# Patient Record
Sex: Female | Born: 1990 | Race: Black or African American | Hispanic: No | Marital: Single | Smoking: Never smoker
Health system: Southern US, Community
[De-identification: ages and names within clinical notes are randomized; demographics above are authoritative.]

## PROBLEM LIST (undated history)

## (undated) ENCOUNTER — Inpatient Hospital Stay (HOSPITAL_COMMUNITY): Payer: Self-pay

## (undated) DIAGNOSIS — K828 Other specified diseases of gallbladder: Secondary | ICD-10-CM

## (undated) DIAGNOSIS — Z789 Other specified health status: Secondary | ICD-10-CM

## (undated) HISTORY — DX: Other specified diseases of gallbladder: K82.8

---

## 2013-12-22 ENCOUNTER — Emergency Department (INDEPENDENT_AMBULATORY_CARE_PROVIDER_SITE_OTHER)
Admission: EM | Admit: 2013-12-22 | Discharge: 2013-12-22 | Disposition: A | Discharge status: Home / Self Care | Payer: Self-pay | Source: Home / Self Care | Attending: Family Medicine | Admitting: Family Medicine

## 2013-12-22 ENCOUNTER — Encounter (HOSPITAL_COMMUNITY): Payer: Self-pay | Admitting: Emergency Medicine

## 2013-12-22 DIAGNOSIS — N61 Mastitis without abscess: Secondary | ICD-10-CM

## 2013-12-22 LAB — POCT PREGNANCY, URINE: Preg Test, Ur: POSITIVE — AB

## 2013-12-22 MED ORDER — CEPHALEXIN 500 MG PO CAPS
500.0000 mg | ORAL_CAPSULE | Freq: Three times a day (TID) | ORAL | Status: DC
Start: 2013-12-22 — End: 2013-12-26

## 2013-12-22 MED ORDER — MINOCYCLINE HCL 100 MG PO CAPS: 100.0000 mg | ORAL_CAPSULE | Freq: Two times a day (BID) | ORAL | Status: DC

## 2013-12-22 NOTE — Discharge Instructions (Signed)
Warm compress twice a day when you take the antibiotic, take all of medicine, return as needed. °

## 2013-12-22 NOTE — ED Provider Notes (Signed)
CSN: [id number]     Arrival date & time 12/22/13  1253 History   First MD Initiated Contact with Patient 12/22/13 1411     Chief Complaint  Patient presents with  . Cellulitis   (Consider location/radiation/quality/duration/timing/severity/associated sxs/prior Treatment) Patient is a 23 y.o. female presenting with abscess. The history is provided by the patient.  Abscess Location:  Torso Torso abscess location:  L chest (left nipple) Abscess quality: fluctuance, induration, painful and redness   Red streaking: no   Duration:  1 week Progression:  Worsening (gradual pain and nipple swelling, removed ring 1 week ago and problem got worse.) Chronicity:  New Context: skin injury   Context comment:  From nipple ring.   History reviewed. No pertinent past medical history. History reviewed. No pertinent past surgical history. History reviewed. No pertinent family history. History  Substance Use Topics  . Smoking status: Never Smoker   . Smokeless tobacco: Not on file  . Alcohol Use: No   OB History   Grav Para Term Preterm Abortions TAB SAB Ect Mult Living                 Review of Systems  Constitutional: Negative.   Skin: Positive for wound.    Allergies  Review of patient's allergies indicates no known allergies.  Home Medications   Current Outpatient Rx  Name  Route  Sig  Dispense  Refill  . cephALEXin (KEFLEX) 500 MG capsule   Oral   Take 1 capsule (500 mg total) by mouth 3 (three) times daily. Take all of medicine and drink lots of fluids   21 capsule   0   . minocycline (MINOCIN,DYNACIN) 100 MG capsule   Oral   Take 1 capsule (100 mg total) by mouth 2 (two) times daily.   14 capsule   0    BP 126/89  Pulse 66  Temp(Src) 97.7 F (36.5 C) (Oral)  Resp 16  SpO2 100%  LMP 11/18/2013 Physical Exam  Nursing note and vitals reviewed. Constitutional: She appears well-developed and well-nourished.  Pulmonary/Chest:    Skin: Skin is warm and dry.    ED  Course  Procedures (including critical care time) Labs Review Labs Reviewed  POCT PREGNANCY, URINE - Abnormal; Notable for the following:    Preg Test, Ur POSITIVE (*)    All other components within normal limits  WOUND CULTURE   Imaging Review No results found.  EKG Interpretation    Date/Time:    Ventricular Rate:    PR Interval:    QRS Duration:   QT Interval:    QTC Calculation:   R Axis:     Text Interpretation:              MDM      Linna HoffJames D Kindl, MD 12/22/13 1443

## 2013-12-22 NOTE — ED Notes (Signed)
Recurrent infection and abscess in left nipple

## 2013-12-25 LAB — WOUND CULTURE

## 2013-12-26 ENCOUNTER — Emergency Department (HOSPITAL_COMMUNITY)
Admission: EM | Admit: 2013-12-26 | Discharge: 2013-12-27 | Disposition: A | Discharge status: Home / Self Care | Payer: Self-pay | Attending: Emergency Medicine | Admitting: Emergency Medicine

## 2013-12-26 ENCOUNTER — Encounter (HOSPITAL_COMMUNITY): Payer: Self-pay | Admitting: Emergency Medicine

## 2013-12-26 DIAGNOSIS — Z3490 Encounter for supervision of normal pregnancy, unspecified, unspecified trimester: Secondary | ICD-10-CM

## 2013-12-26 DIAGNOSIS — N898 Other specified noninflammatory disorders of vagina: Secondary | ICD-10-CM | POA: Insufficient documentation

## 2013-12-26 DIAGNOSIS — O9989 Other specified diseases and conditions complicating pregnancy, childbirth and the puerperium: Secondary | ICD-10-CM | POA: Insufficient documentation

## 2013-12-26 DIAGNOSIS — B3731 Acute candidiasis of vulva and vagina: Secondary | ICD-10-CM | POA: Insufficient documentation

## 2013-12-26 DIAGNOSIS — B373 Candidiasis of vulva and vagina: Secondary | ICD-10-CM | POA: Insufficient documentation

## 2013-12-26 LAB — URINE MICROSCOPIC-ADD ON

## 2013-12-26 LAB — URINALYSIS, ROUTINE W REFLEX MICROSCOPIC
Bilirubin Urine: NEGATIVE
Glucose, UA: NEGATIVE mg/dL
NITRITE: NEGATIVE
PROTEIN: NEGATIVE mg/dL
SPECIFIC GRAVITY, URINE: 1.024 (ref 1.005–1.030)
Urobilinogen, UA: 1 mg/dL (ref 0.0–1.0)
pH: 6 (ref 5.0–8.0)

## 2013-12-26 LAB — WET PREP, GENITAL
Clue Cells Wet Prep HPF POC: NONE SEEN
Trich, Wet Prep: NONE SEEN

## 2013-12-26 LAB — POCT PREGNANCY, URINE: Preg Test, Ur: POSITIVE — AB

## 2013-12-26 MED ORDER — CLOTRIMAZOLE 1 % VA CREA: 1.0000 | TOPICAL_CREAM | Freq: Every day | VAGINAL | Status: DC

## 2013-12-26 NOTE — Discharge Instructions (Signed)
Please read and follow all provided instructions.  Your diagnoses today include:  1. Vulvovaginal candidiasis     Tests performed today include:  Wet prep - shows yeast infection  Urine test - no definite urinary tract infection  Vital signs. See below for your results today.   Medications prescribed:   Clotrimazole - medication for yeast infection  Take any prescribed medications only as directed.  Home care instructions:  Follow any educational materials contained in this packet.  Follow-up instructions: Please follow-up with your primary care provider in the next 3 days for further evaluation of your symptoms. If you do not have a primary care doctor -- see below for referral information.   Return instructions:   Please return to the Emergency Department if you experience worsening symptoms.   Please return if you have any other emergent concerns.  Additional Information:  Your vital signs today were: BP 122/64   Pulse 84   Temp(Src) 97.8 F (36.6 C) (Oral)   Resp 16   Ht 5\' 3"  (1.6 m)   Wt 139 lb 14.4 oz (63.458 kg)   BMI 24.79 kg/m2   SpO2 100%   LMP 11/18/2013 If your blood pressure (BP) was elevated above 135/85 this visit, please have this repeated by your doctor within one month. --------------

## 2013-12-26 NOTE — ED Provider Notes (Signed)
CSN: [id number]     Arrival date & time 12/26/13  2157 History   First MD Initiated Contact with Patient 12/26/13 2222     This chart was scribed for non-physician practitioner, Rhea Bleacher PA-C working with Toy Baker, MD by Arlan Organ, ED Scribe. This patient was seen in room TR11C/TR11C and the patient's care was started at 10:31 PM.   Chief Complaint  Patient presents with  . Vaginal Itching   The history is provided by the patient. No language interpreter was used.    HPI Comments: Alora Gorey is a 23 y.o. Pregnant (first trimester) female who presents to the Emergency Department complaining of gradual onset, constant, moderate vaginal itching and irritation that initially started earlier this morning. Pt was recently seen on 1/7, diagnosed with a "staph infection", and was put on antibiotics. She reports having a yeast infection a number of years ago, and says her current symptoms are similar to what she experienced. She reports taking AZO with no improvement. She denies back pain, fever, dysuria, vaginal bleeding, or discharge. LMP beginning of December. Pt reports a positive pregnancy test about 2 days.   History reviewed. No pertinent past medical history. History reviewed. No pertinent past surgical history. No family history on file. History  Substance Use Topics  . Smoking status: Never Smoker   . Smokeless tobacco: Not on file  . Alcohol Use: No   OB History   Grav Para Term Preterm Abortions TAB SAB Ect Mult Living                 Review of Systems  Constitutional: Negative for fever.  HENT: Negative for rhinorrhea and sore throat.   Eyes: Negative for redness.  Respiratory: Negative for cough.   Cardiovascular: Negative for chest pain.  Gastrointestinal: Negative for nausea, vomiting, abdominal pain and diarrhea.  Genitourinary: Negative for dysuria, vaginal bleeding and vaginal discharge.       Vaginal Itching  Musculoskeletal: Negative for back pain  and myalgias.  Skin: Negative for rash.  Neurological: Negative for headaches.    Allergies  Review of patient's allergies indicates no known allergies.  Home Medications   Current Outpatient Rx  Name  Route  Sig  Dispense  Refill  . cephALEXin (KEFLEX) 500 MG capsule   Oral   Take 500 mg by mouth 3 (three) times daily.         . Prenatal Vit-Fe Fumarate-FA (MULTIVITAMIN-PRENATAL) 27-0.8 MG TABS tablet   Oral   Take 1 tablet by mouth daily at 12 noon.          Triage Vitals: BP 122/64  Pulse 84  Temp(Src) 97.8 F (36.6 C) (Oral)  Resp 16  Ht 5\' 3"  (1.6 m)  Wt 139 lb 14.4 oz (63.458 kg)  BMI 24.79 kg/m2  SpO2 100%  LMP 11/18/2013  Physical Exam  Nursing note and vitals reviewed. Constitutional: She is oriented to person, place, and time. She appears well-developed and well-nourished.  HENT:  Head: Normocephalic and atraumatic.  Eyes: EOM are normal.  Neck: Normal range of motion.  Cardiovascular: Normal rate.   Pulmonary/Chest: Effort normal.  Abdominal: Soft. She exhibits no distension. There is no tenderness. There is no rebound and no guarding.  Genitourinary: Uterus normal. There is rash (mild erythema) on the right labia. There is no tenderness or lesion on the right labia. There is rash (mild erythema) on the left labia. There is no tenderness or lesion on the left labia. Cervix exhibits no  motion tenderness and no discharge. Right adnexum displays no mass and no tenderness. Left adnexum displays no mass and no tenderness. No erythema or tenderness around the vagina. Vaginal discharge (mild white curdlike) found.  Musculoskeletal: Normal range of motion.  Neurological: She is alert and oriented to person, place, and time.  Skin: Skin is warm and dry.  Psychiatric: She has a normal mood and affect. Her behavior is normal.    ED Course  Procedures (including critical care time)  DIAGNOSTIC STUDIES: Oxygen Saturation is 100% on RA, Normal by my  interpretation.    COORDINATION OF CARE: 10:45 PM- Will perform pelvic exam. Discussed treatment plan with pt at bedside and pt agreed to plan.     Labs Review Labs Reviewed  POCT PREGNANCY, URINE - Abnormal; Notable for the following:    Preg Test, Ur POSITIVE (*)    All other components within normal limits  URINALYSIS, ROUTINE W REFLEX MICROSCOPIC   Imaging Review No results found.  EKG Interpretation   None      Vital signs reviewed and are as follows: Filed Vitals:   12/26/13 2355  BP: 107/68  Pulse: 70  Temp: 97.3 F (36.3 C)  Resp: 16   Pelvic performed by Dala DockFierke PA-S2 under my supervision. Nurse chaperone present.   Will treat for yeast infection.   [facility] given.  Patient urged to return with worsening symptoms or other concerns. Patient verbalized understanding and agrees with plan.     MDM   1. Vulvovaginal candidiasis   2. Pregnancy    Pt with yeast infection after initiation of antibiotics. Vaginal clotrimazole given due to oral azoles contraindicated in pregnancy.  I personally performed the services described in this documentation, which was scribed in my presence. The recorded information has been reviewed and is accurate.   Renne CriglerJoshua Vishwa Dais, PA-C 12/28/13 1526

## 2013-12-26 NOTE — ED Notes (Addendum)
Pt. reports vaginal itching / irritation onset today , denies vaginal discharge or dysuria . Pt. stated positive pregnancy test from her previous urgent care visit unsure of AOG .

## 2013-12-27 LAB — GC/CHLAMYDIA PROBE AMP
CT PROBE, AMP APTIMA: NEGATIVE
GC PROBE AMP APTIMA: NEGATIVE

## 2013-12-28 ENCOUNTER — Telehealth (HOSPITAL_COMMUNITY): Payer: Self-pay | Admitting: *Deleted

## 2013-12-28 LAB — URINE CULTURE
COLONY COUNT: NO GROWTH
CULTURE: NO GROWTH

## 2013-12-28 NOTE — ED Notes (Addendum)
Dr. Artis FlockKindl said he did give pt. Keflex and treatment is adequate.  I called pt. and left a message to call. Vassie MoselleYork, Marshal Schrecengost M 12/28/2013 Pt. called back.  Pt. verified x 2 and given results. Pt. told she was adequately treated and to finish all of antibiotics.  If not better after finishing her medicine to come back for a recheck. Pt. voiced understanding. 12/28/2013

## 2013-12-28 NOTE — ED Notes (Signed)
Wound culture breast: few Strep Group C.  Pt. adequately treated with Keflex.  Vassie MoselleYork, Ayodele Hartsock M 12/28/2013

## 2013-12-29 NOTE — ED Provider Notes (Signed)
Medical screening examination/treatment/procedure(s) were performed by non-physician practitioner and as supervising physician I was immediately available for consultation/collaboration.  Toy BakerAnthony T Berneita Sanagustin, MD 12/29/13 [id number]

## 2014-01-09 ENCOUNTER — Inpatient Hospital Stay (HOSPITAL_COMMUNITY)
Admission: AD | Admit: 2014-01-09 | Discharge: 2014-01-09 | Disposition: A | Discharge status: Home / Self Care | Payer: Self-pay | Attending: Obstetrics and Gynecology | Admitting: Obstetrics and Gynecology

## 2014-01-09 ENCOUNTER — Encounter (HOSPITAL_COMMUNITY): Payer: Self-pay | Admitting: *Deleted

## 2014-01-09 DIAGNOSIS — O210 Mild hyperemesis gravidarum: Secondary | ICD-10-CM | POA: Insufficient documentation

## 2014-01-09 DIAGNOSIS — O219 Vomiting of pregnancy, unspecified: Secondary | ICD-10-CM

## 2014-01-09 HISTORY — DX: Other specified health status: Z78.9

## 2014-01-09 LAB — URINALYSIS, ROUTINE W REFLEX MICROSCOPIC
Glucose, UA: NEGATIVE mg/dL
Hgb urine dipstick: NEGATIVE
Ketones, ur: >80 mg/dL — AB
Leukocytes, UA: NEGATIVE
NITRITE: NEGATIVE
PROTEIN: NEGATIVE mg/dL
Specific Gravity, Urine: 1.025 (ref 1.005–1.030)
UROBILINOGEN UA: 0.2 mg/dL (ref 0.0–1.0)
pH: 6 (ref 5.0–8.0)

## 2014-01-09 LAB — URINE MICROSCOPIC-ADD ON

## 2014-01-09 LAB — WET PREP, GENITAL
TRICH WET PREP: NONE SEEN
Yeast Wet Prep HPF POC: NONE SEEN

## 2014-01-09 MED ORDER — PROMETHAZINE HCL 12.5 MG PO TABS: 12.5000 mg | ORAL_TABLET | Freq: Four times a day (QID) | ORAL | Status: DC | PRN

## 2014-01-09 MED ORDER — PROMETHAZINE HCL 25 MG PO TABS
25.0000 mg | ORAL_TABLET | Freq: Once | ORAL | Status: AC
  Administered 2014-01-09: 25 mg via ORAL
  Filled 2014-01-09: qty 1

## 2014-01-09 NOTE — MAU Note (Signed)
Pt presents with complaints of nausea, vomiting and had a positive pregnancy test January the 9th. She states that she can't keep anything down.

## 2014-01-09 NOTE — MAU Note (Signed)
Pt states at beginning of month had abx then a week later developed yeast infection. Was given OTC cream. Unsure if yeast infection is gone. Denies pain or bleeding.

## 2014-01-09 NOTE — Discharge Instructions (Signed)
Morning Sickness °Morning sickness is when you feel sick to your stomach (nauseous) during pregnancy. This nauseous feeling may or may not come with vomiting. It often occurs in the morning but can be a problem any time of day. Morning sickness is most common during the first trimester, but it may continue throughout pregnancy. While morning sickness is unpleasant, it is usually harmless unless you develop severe and continual vomiting (hyperemesis gravidarum). This condition requires more intense treatment.  °CAUSES  °The cause of morning sickness is not completely known but seems to be related to normal hormonal changes that occur in pregnancy. °RISK FACTORS °You are at greater risk if you: °· Experienced nausea or vomiting before your pregnancy. °· Had morning sickness during a previous pregnancy. °· Are pregnant with more than one baby, such as twins. °TREATMENT  °Do not use any medicines (prescription, over-the-counter, or herbal) for morning sickness without first talking to your health care provider. Your health care provider may prescribe or recommend: °· Vitamin B6 supplements. °· Anti-nausea medicines. °· The herbal medicine ginger. °HOME CARE INSTRUCTIONS  °· Only take over-the-counter or prescription medicines as directed by your health care provider. °· Taking multivitamins before getting pregnant can prevent or decrease the severity of morning sickness in most women.   °· Eat a piece of dry toast or unsalted crackers before getting out of bed in the morning.   °· Eat five or six small meals a day.   °· Eat dry and bland foods (rice, baked potato ). Foods high in carbohydrates are often helpful.  °· Do not drink liquids with your meals. Drink liquids between meals.   °· Avoid greasy, fatty, and spicy foods.   °· Get someone to cook for you if the smell of any food causes nausea and vomiting.   °· If you feel nauseous after taking prenatal vitamins, take the vitamins at night or with a snack.  °· Snack  on protein foods (nuts, yogurt, cheese) between meals if you are hungry.   °· Eat unsweetened gelatins for desserts.   °· Wearing an acupressure wristband (worn for sea sickness) may be helpful.   °· Acupuncture may be helpful.   °· Do not smoke.   °· Get a humidifier to keep the air in your house free of odors.   °· Get plenty of fresh air. °SEEK MEDICAL CARE IF:  °· Your home remedies are not working, and you need medicine. °· You feel dizzy or lightheaded. °· You are losing weight. °SEEK IMMEDIATE MEDICAL CARE IF:  °· You have persistent and uncontrolled nausea and vomiting. °· You pass out (faint). °Document Released: 01/23/2007 Document Revised: 08/04/2013 Document Reviewed: 05/19/2013 °ExitCare® Patient Information ©2014 [employer]. ° °

## 2014-01-09 NOTE — MAU Provider Note (Signed)
History     CSN: [id number]  Arrival date and time: 01/09/14 1117   First Provider Initiated Contact with Patient 01/09/14 1204      Chief Complaint  Patient presents with  . Morning Sickness  . Possible Pregnancy   HPI  Toni Romero is a 23 y.o. G1P0 at 6719w3d who presents today with nausea and vomiting. She states that she vomits everything that she eats or drinks. She has not been given any nausea meds to try at this time. She was treated for an abscess, and then a yeast infection. She also feels that the yeast infection has not gotten better. She denies any vaginal bleeding or pain.   She has an appointment at the Clinic for 02/15/14.   Past Medical History  Diagnosis Date  . Medical history non-contributory     Past Surgical History  Procedure Laterality Date  . No past surgeries      History reviewed. No pertinent family history.  History  Substance Use Topics  . Smoking status: Never Smoker   . Smokeless tobacco: Never Used  . Alcohol Use: No    Allergies: No Known Allergies  Prescriptions prior to admission  Medication Sig Dispense Refill  . clotrimazole (GYNE-LOTRIMIN) 1 % vaginal cream Place 1 Applicatorful vaginally at bedtime. Use for 7 days.  45 g  0  . Prenatal Vit-Fe Fumarate-FA (MULTIVITAMIN-PRENATAL) 27-0.8 MG TABS tablet Take 1 tablet by mouth daily at 12 noon.        ROS Physical Exam   Blood pressure 123/84, pulse 84, temperature 97.6 F (36.4 C), resp. rate 16, height 5\' 3"  (1.6 m), weight 58.06 kg (128 lb), last menstrual period 11/18/2013.  Physical Exam  MAU Course  Procedures  Results for orders placed during the hospital encounter of 01/09/14 (from the past 24 hour(s))  URINALYSIS, ROUTINE W REFLEX MICROSCOPIC     Status: Abnormal   Collection Time    01/09/14 11:30 AM      Result Value Range   Color, Urine YELLOW  YELLOW   APPearance HAZY (*) CLEAR   Specific Gravity, Urine 1.025  1.005 - 1.030   pH 6.0  5.0 - 8.0   Glucose, UA NEGATIVE  NEGATIVE mg/dL   Hgb urine dipstick NEGATIVE  NEGATIVE   Bilirubin Urine SMALL (*) NEGATIVE   Ketones, ur >80 (*) NEGATIVE mg/dL   Protein, ur NEGATIVE  NEGATIVE mg/dL   Urobilinogen, UA 0.2  0.0 - 1.0 mg/dL   Nitrite NEGATIVE  NEGATIVE   Leukocytes, UA NEGATIVE  NEGATIVE  URINE MICROSCOPIC-ADD ON     Status: Abnormal   Collection Time    01/09/14 11:30 AM      Result Value Range   Squamous Epithelial / LPF MANY (*) RARE   WBC, UA 7-10  <3 WBC/hpf   RBC / HPF 7-10  <3 RBC/hpf   Bacteria, UA MANY (*) RARE   Urine-Other MUCOUS PRESENT    WET PREP, GENITAL     Status: Abnormal   Collection Time    01/09/14 12:15 PM      Result Value Range   Yeast Wet Prep HPF POC NONE SEEN  NONE SEEN   Trich, Wet Prep NONE SEEN  NONE SEEN   Clue Cells Wet Prep HPF POC MODERATE (*) NONE SEEN   WBC, Wet Prep HPF POC FEW (*) NONE SEEN   1333: Patient is tolerating PO fluids and food after phenergan   Assessment and Plan   1. Nausea/vomiting in pregnancy  Return to MAU as needed     Medication List         clotrimazole 1 % vaginal cream  Commonly known as:  GYNE-LOTRIMIN  Place 1 Applicatorful vaginally at bedtime. Use for 7 days.     multivitamin-prenatal 27-0.8 MG Tabs tablet  Take 1 tablet by mouth daily at 12 noon.     promethazine 12.5 MG tablet  Commonly known as:  PHENERGAN  Take 1 tablet (12.5 mg total) by mouth every 6 (six) hours as needed for nausea or vomiting.       Follow-up Information   Follow up with [facility]. (as planned )    Specialty:  Obstetrics and Gynecology   Contact information:   [address] [place] [address] [phone]       Tawnya Crook 01/09/2014, 12:07 PM

## 2014-01-10 LAB — URINE CULTURE: Colony Count: 8000

## 2014-01-12 NOTE — MAU Provider Note (Signed)
Attestation of Attending Supervision of Advanced Practitioner: Evaluation and management procedures were performed by the PA/NP/CNM/OB Fellow under my supervision/collaboration. Chart reviewed and agree with management and plan.  Tilda BurrowFERGUSON,Ocean Schildt V 01/12/2014 5:53 PM

## 2014-01-20 ENCOUNTER — Inpatient Hospital Stay (HOSPITAL_COMMUNITY)
Admission: AD | Admit: 2014-01-20 | Discharge: 2014-01-20 | Disposition: A | Discharge status: Home / Self Care | Payer: Self-pay | Source: Ambulatory Visit | Attending: Obstetrics & Gynecology | Admitting: Obstetrics & Gynecology

## 2014-01-20 ENCOUNTER — Encounter (HOSPITAL_COMMUNITY): Payer: Self-pay | Admitting: *Deleted

## 2014-01-20 DIAGNOSIS — R1110 Vomiting, unspecified: Secondary | ICD-10-CM

## 2014-01-20 DIAGNOSIS — IMO0002 Reserved for concepts with insufficient information to code with codable children: Secondary | ICD-10-CM | POA: Insufficient documentation

## 2014-01-20 DIAGNOSIS — E860 Dehydration: Secondary | ICD-10-CM | POA: Insufficient documentation

## 2014-01-20 DIAGNOSIS — O211 Hyperemesis gravidarum with metabolic disturbance: Secondary | ICD-10-CM | POA: Insufficient documentation

## 2014-01-20 DIAGNOSIS — R1115 Cyclical vomiting syndrome unrelated to migraine: Secondary | ICD-10-CM

## 2014-01-20 LAB — URINALYSIS, ROUTINE W REFLEX MICROSCOPIC
Bilirubin Urine: NEGATIVE
GLUCOSE, UA: 500 mg/dL — AB
HGB URINE DIPSTICK: NEGATIVE
Ketones, ur: >80 mg/dL — AB
LEUKOCYTES UA: NEGATIVE
Nitrite: NEGATIVE
PH: 6 (ref 5.0–8.0)
PROTEIN: NEGATIVE mg/dL
Specific Gravity, Urine: 1.015 (ref 1.005–1.030)
Urobilinogen, UA: 0.2 mg/dL (ref 0.0–1.0)

## 2014-01-20 MED ORDER — LACTATED RINGERS IV BOLUS (SEPSIS)
1000.0000 mL | Freq: Once | INTRAVENOUS | Status: AC
  Administered 2014-01-20: 1000 mL via INTRAVENOUS

## 2014-01-20 MED ORDER — DEXTROSE IN LACTATED RINGERS 5 % IV SOLN: 25.0000 mg | Freq: Once | INTRAVENOUS | Status: DC

## 2014-01-20 MED ORDER — ONDANSETRON HCL 4 MG/2ML IJ SOLN
4.0000 mg | Freq: Once | INTRAMUSCULAR | Status: AC
  Administered 2014-01-20: 4 mg via INTRAVENOUS
  Filled 2014-01-20: qty 2

## 2014-01-20 MED ORDER — FAMOTIDINE 20 MG PO TABS: 20.0000 mg | ORAL_TABLET | Freq: Two times a day (BID) | ORAL | Status: DC

## 2014-01-20 MED ORDER — DEXTROSE 5 % IN LACTATED RINGERS IV BOLUS
1000.0000 mL | Freq: Once | INTRAVENOUS | Status: AC
  Administered 2014-01-20: 1000 mL via INTRAVENOUS

## 2014-01-20 MED ORDER — FAMOTIDINE IN NACL 20-0.9 MG/50ML-% IV SOLN
20.0000 mg | Freq: Once | INTRAVENOUS | Status: AC
  Administered 2014-01-20: 20 mg via INTRAVENOUS
  Filled 2014-01-20: qty 50

## 2014-01-20 MED ORDER — ONDANSETRON HCL 4 MG PO TABS: 4.0000 mg | ORAL_TABLET | Freq: Four times a day (QID) | ORAL | Status: DC

## 2014-01-20 NOTE — MAU Provider Note (Signed)
History     CSN: [id number]  Arrival date and time: 01/20/14 1059   First Provider Initiated Contact with Patient 01/20/14 1204      Chief Complaint  Patient presents with  . Emesis During Pregnancy  . Extremity Weakness   HPI  Toni Toni is a 23 y.o. G1P0 at [redacted]w[redacted]d determined by LMP who presents today with nausea, vomiting, and fatigue.  She has been unable to eat solid food, but is hydrating and urinating. She denies syncopal episodes, but states she feels sick and gets short of breath when walking around the house. She describes chest pain and a burning sensation after she eats, possibly related to GERD. She denies vaginal bleeding, but occasionally has minimal clear/white discharge after vomiting.  She reports "random cramping before BM," but denies constant or frequent pain.  She was last seen in MAU on 01/09/14 for nausea and vomiting and was prescribed phenergan. She reports that the phenergan makes her chest hurt and did not relieve her nausea and vomiting. She has stopped taking her prenatal vitamins due to her Romero/V.  She is planning to receive prenatal care at [facility] with her first appt on 02/15/14. She has not received an [place].   OB History   Grav Para Term Preterm Abortions TAB SAB Ect Mult Living   1               Past Medical History  Diagnosis Date  . Medical history non-contributory     Past Surgical History  Procedure Laterality Date  . No past surgeries      History reviewed. No pertinent family history.  History  Substance Use Topics  . Smoking status: Never Smoker   . Smokeless tobacco: Never Used  . Alcohol Use: No    Allergies: No Known Allergies  Prescriptions prior to admission  Medication Sig Dispense Refill  . promethazine (PHENERGAN) 12.5 MG tablet Take 1 tablet (12.5 mg total) by mouth every 6 (six) hours as needed for nausea or vomiting.  30 tablet  3    Review of Systems  Constitutional: Positive for weight loss. Negative for fever.  Eyes:  Negative for blurred vision.  Respiratory: Positive for shortness of breath.   Gastrointestinal: Positive for heartburn, nausea and vomiting. Negative for diarrhea.  Genitourinary: Negative for dysuria.  Neurological: Positive for weakness. Negative for dizziness and headaches.   Physical Exam   Blood pressure 116/69, pulse 103, temperature 97.9 F (36.6 C), temperature source Oral, resp. rate 16, height 5' 3.5" (1.613 m), weight 52.889 kg (116 lb 9.6 oz), last menstrual period 11/18/2013, SpO2 99.00%.  Physical Exam  Constitutional: She is oriented to person, place, and time. She appears well-developed. No distress.  Eyes: EOM are normal.  Respiratory: Effort normal.  GI: Soft. She exhibits no distension and no mass. Bowel sounds are decreased. There is no tenderness. There is no rebound and no guarding.  Neurological: She is alert and oriented to person, place, and time.  Skin: Skin is warm and dry. There is pallor.  Psychiatric: She has a normal mood and affect.   Results for orders placed during the hospital encounter of 01/20/14 (from the past 24 hour(s))  URINALYSIS, ROUTINE W REFLEX MICROSCOPIC     Status: Abnormal   Collection Time    01/20/14  2:32 PM      Result Value Range   Color, Urine YELLOW  YELLOW   APPearance HAZY (*) CLEAR   Specific Gravity, Urine 1.015  1.005 - 1.030  pH 6.0  5.0 - 8.0   Glucose, UA 500 (*) NEGATIVE mg/dL   Hgb urine dipstick NEGATIVE  NEGATIVE   Bilirubin Urine NEGATIVE  NEGATIVE   Ketones, ur >80 (*) NEGATIVE mg/dL   Protein, ur NEGATIVE  NEGATIVE mg/dL   Urobilinogen, UA 0.2  0.0 - 1.0 mg/dL   Nitrite NEGATIVE  NEGATIVE   Leukocytes, UA NEGATIVE  NEGATIVE    MAU Course  Procedures  MDM Patient unable to give urine sample upon arrival UA today  Patient given 2 L IV fluids, 4 mg Zofran and 20 mg IV pepcid Patient able to tolerate PO in MAU  Assessment and Plan  Assessment 1. Nausea and vomiting 2. Dehydration  Plan Discharge  home Rx for Zofran and Pepcid sent to patient's pharmacy Counseling for Romero/V during first trimester, importance of hydration during pregnancy AVS contains information hyperemesis diet Patient encourage to keep appt in clinic, switch to gummy prenatal vitamins F/U with MAU as needed  Toni Toni 01/20/2014, 12:17 PM   I have seen and evaluated the patient with the PA student. I agree with the assessment and plan as written above.   Freddi StarrJulie Romero Ethier, PA-C 01/20/2014 6:00 PM

## 2014-01-20 NOTE — MAU Note (Signed)
Patient states she has had nausea and vomiting since she found out she was pregnant. Was given an Rx for Phenergan but she does not like the way it makes her chest hurt. Feels extreme weakness. Has a slight discharge, no diarrhea. Denies S/S of the flu.

## 2014-01-20 NOTE — Discharge Instructions (Signed)
Hyperemesis Gravidarum Diet °Hyperemesis gravidarum is a severe form of morning sickness. It is characterized by frequent and severe vomiting. It happens during the first trimester of pregnancy. It may be caused by the rapid hormone changes that happen during pregnancy. It is associated with a 5% weight loss of pre-pregnancy weight. The hyperemesis diet may be used to lessen symptoms of nausea and vomiting. °EATING GUIDELINES °· Eat 5 to 6 small meals daily instead of 3 large meals. °· Avoid foods with strong smells. °· Avoid drinking 30 minutes before and after meals. °· Avoid fried or high-fat foods, such as butter and cream sauces. °· Starchy foods are usually well-tolerated, such as cereal, toast, bread, potatoes, pasta, rice, and pretzels. °· Eat crackers before you get out of bed in the morning. °· Avoid spicy foods. °· Ginger may help with nausea. Add ¼ tsp ginger to hot tea or choose ginger tea. °· Continue to take your prenatal vitamins as directed by your caregiver. °SAMPLE MEAL PLAN °Breakfast  °· ½ cup oatmeal °· 1 slice toast °· 1 tsp heart-healthy margarine °· 1 tsp jelly °· 1 scrambled egg °Midmorning Snack  °· 1 cup low-fat yogurt °Lunch  °· Plain ham sandwich °· Carrot or celery sticks °· 1 small apple °· 3 graham crackers °Midafternoon Snack  °· Cheese and crackers °Dinner °· 4 oz pork tenderloin °· 1 small baked potato °· 1 tsp margarine °· ½ cup broccoli °· ½ cup grapes °Evening Snack °· 1 cup pudding °Document Released: 09/29/2007 Document Revised: 02/24/2012 Document Reviewed: 05/04/2013 °[employer]® Patient Information ©2014 [employer]. ° °

## 2014-02-15 ENCOUNTER — Ambulatory Visit (INDEPENDENT_AMBULATORY_CARE_PROVIDER_SITE_OTHER): Payer: Self-pay | Admitting: Advanced Practice Midwife

## 2014-02-15 ENCOUNTER — Ambulatory Visit (HOSPITAL_COMMUNITY)
Admission: RE | Admit: 2014-02-15 | Discharge: 2014-02-15 | Disposition: A | Discharge status: Home / Self Care | Payer: Self-pay | Source: Ambulatory Visit | Attending: Advanced Practice Midwife | Admitting: Advanced Practice Midwife

## 2014-02-15 ENCOUNTER — Encounter: Payer: Self-pay | Admitting: Advanced Practice Midwife

## 2014-02-15 VITALS — BP 104/64 | Temp 96.9°F | Wt 126.9 lb

## 2014-02-15 DIAGNOSIS — O209 Hemorrhage in early pregnancy, unspecified: Secondary | ICD-10-CM

## 2014-02-15 DIAGNOSIS — O021 Missed abortion: Secondary | ICD-10-CM

## 2014-02-15 DIAGNOSIS — IMO0002 Reserved for concepts with insufficient information to code with codable children: Secondary | ICD-10-CM

## 2014-02-15 DIAGNOSIS — O219 Vomiting of pregnancy, unspecified: Secondary | ICD-10-CM | POA: Insufficient documentation

## 2014-02-15 LAB — POCT URINALYSIS DIP (DEVICE)
Bilirubin Urine: NEGATIVE
Glucose, UA: NEGATIVE mg/dL
Ketones, ur: NEGATIVE mg/dL
LEUKOCYTES UA: NEGATIVE
NITRITE: NEGATIVE
PH: 7 (ref 5.0–8.0)
Protein, ur: NEGATIVE mg/dL
Specific Gravity, Urine: 1.025 (ref 1.005–1.030)
UROBILINOGEN UA: 2 mg/dL — AB (ref 0.0–1.0)

## 2014-02-15 NOTE — Progress Notes (Signed)
P= 68 Discussed appropriate weight gain (25-30lb); pt. Verbalized understanding.  New OB packet given.  Flu shot declined.

## 2014-02-15 NOTE — Patient Instructions (Signed)
Miscarriage A miscarriage is the sudden loss of an unborn baby (fetus) before the 20th week of pregnancy. Most miscarriages happen in the first 3 months of pregnancy. Sometimes, it happens before a woman even knows she is pregnant. A miscarriage is also called a "spontaneous miscarriage" or "early pregnancy loss." Having a miscarriage can be an emotional experience. Talk with your caregiver about any questions you may have about miscarrying, the grieving process, and your future pregnancy plans. CAUSES   Problems with the fetal chromosomes that make it impossible for the baby to develop normally. Problems with the baby's genes or chromosomes are most often the result of errors that occur, by chance, as the embryo divides and grows. The problems are not inherited from the parents.  Infection of the cervix or uterus.   Hormone problems.   Problems with the cervix, such as having an incompetent cervix. This is when the tissue in the cervix is not strong enough to hold the pregnancy.   Problems with the uterus, such as an abnormally shaped uterus, uterine fibroids, or congenital abnormalities.   Certain medical conditions.   Smoking, drinking alcohol, or taking illegal drugs.   Trauma.  Often, the cause of a miscarriage is unknown.  SYMPTOMS   Vaginal bleeding or spotting, with or without cramps or pain.  Pain or cramping in the abdomen or lower back.  Passing fluid, tissue, or blood clots from the vagina. DIAGNOSIS  Your caregiver will perform a physical exam. You may also have an ultrasound to confirm the miscarriage. Blood or urine tests may also be ordered. TREATMENT   Sometimes, treatment is not necessary if you naturally pass all the fetal tissue that was in the uterus. If some of the fetus or placenta remains in the body (incomplete miscarriage), tissue left behind may become infected and must be removed. Usually, a dilation and curettage (D and C) procedure is performed.  During a D and C procedure, the cervix is widened (dilated) and any remaining fetal or placental tissue is gently removed from the uterus.  Antibiotic medicines are prescribed if there is an infection. Other medicines may be given to reduce the size of the uterus (contract) if there is a lot of bleeding.  If you have Rh negative blood and your baby was Rh positive, you will need a Rh immunoglobulin shot. This shot will protect any future baby from having Rh blood problems in future pregnancies. HOME CARE INSTRUCTIONS   Your caregiver may order bed rest or may allow you to continue light activity. Resume activity as directed by your caregiver.  Have someone help with home and family responsibilities during this time.   Keep track of the number of sanitary pads you use each day and how soaked (saturated) they are. Write down this information.   Do not use tampons. Do not douche or have sexual intercourse until approved by your caregiver.   Only take over-the-counter or prescription medicines for pain or discomfort as directed by your caregiver.   Do not take aspirin. Aspirin can cause bleeding.   Keep all follow-up appointments with your caregiver.   If you or your partner have problems with grieving, talk to your caregiver or seek counseling to help cope with the pregnancy loss. Allow enough time to grieve before trying to get pregnant again.  SEEK IMMEDIATE MEDICAL CARE IF:   You have severe cramps or pain in your back or abdomen.  You have a fever.  You pass large blood clots (walnut-sized   or larger) ortissue from your vagina. Save any tissue for your caregiver to inspect.   Your bleeding increases.   You have a thick, bad-smelling vaginal discharge.  You become lightheaded, weak, or you faint.   You have chills.  MAKE SURE YOU:  Understand these instructions.  Will watch your condition.  Will get help right away if you are not doing well or get  worse. Document Released: 05/28/2001 Document Revised: 03/29/2013 Document Reviewed: 01/21/2012 [employer] Patient Information 2014 [employer].  

## 2014-02-15 NOTE — Progress Notes (Signed)
Here for New OB. Very excited about baby. During exam, unable to hear fetal heart tones. Cultures already done in prior [facility] visit. Too young for pap. Sent to Radiology for [place] >> IUFD, sized about 7-8 weeks with no FHTs visible. Discussed options of 1) Expectant management, this may happen since there was some blood in exam today, but since she has not passed fetus yet, may not be complete.  2)  Cytotec management, discussed how process works, Rxes would be needed for meds and analgesics + phenergan,   3)  D&C, not best option with risks of anesthesia and surgery, but may be needed at some point.   Patient is undecided. FOB left room upset.  Pt wishes to think about it and call us back.    Subjective:    Toni Romero is a G1P0 [redacted]w[redacted]d being seen today for her first obstetrical visit.  Her obstetrical history is significant for nothing. Patient does intend to breast feed. Pregnancy history fully reviewed.  Patient reports no complaints.  Filed Vitals:   02/15/14 1301  BP: 104/64  Temp: 96.9 F (36.1 C)  Weight: 57.561 kg (126 lb 14.4 oz)    HISTORY: OB History  Gravida Para Term Preterm AB SAB TAB Ectopic Multiple Living  1             # Outcome Date GA Lbr Len/2nd Weight Sex Delivery Anes PTL Lv  1 CUR              Past Medical History  Diagnosis Date  . Medical history non-contributory    Past Surgical History  Procedure Laterality Date  . No past surgeries     History reviewed. No pertinent family history.   Exam    Uterus:  Fundal Height: 8 cm  Pelvic Exam:    Perineum: No Hemorrhoids, Normal Perineum   Vulva: Bartholin's, Urethra, Skene's normal   Vagina:  normal mucosa, normal discharge   pH:    Cervix: no cervical motion tenderness, no lesions and light blood after digital exam   Adnexa: normal adnexa and no mass, fullness, tenderness   Bony Pelvis: gynecoid  System: Breast:  normal appearance, no masses or tenderness   Skin: normal coloration and turgor, no  rashes    Neurologic: oriented, normal, grossly non-focal   Extremities: normal strength, tone, and muscle mass   HEENT neck supple with midline trachea   Mouth/Teeth mucous membranes moist, pharynx normal without lesions   Neck supple and no masses   Cardiovascular: regular rate and rhythm, no murmurs or gallops   Respiratory:  appears well, vitals normal, no respiratory distress, acyanotic, normal RR, ear and throat exam is normal, neck free of mass or lymphadenopathy, chest clear, no wheezing, crepitations, rhonchi, normal symmetric air entry   Abdomen: soft, non-tender; bowel sounds normal; no masses,  no organomegaly   Urinary: urethral meatus normal   US Ob Comp Less 14 Wks  02/15/2014   CLINICAL DATA:  23 year old female with estimated gestational age of [redacted] weeks and 5 days by LMP. Unable to Doppler fetal heart tones. Initial encounter.  EXAM: OBSTETRIC <14 WK ULTRASOUND  TECHNIQUE: Transabdominal ultrasound was performed for evaluation of the gestation as well as the maternal uterus and adnexal regions.  COMPARISON:  None.    FINDINGS: Intrauterine gestational sac: Single  Yolk sac:  Not visible  Embryo:  Visible  Cardiac Activity: Not detected.  Negative M-mode Doppler for any evidence of fetal cardiac activity. Color Doppler also negative for  any fetal vascularity (images 34 and 35).  Maternal uterus/adnexae: No subchorionic hemorrhage or pelvic free fluid. Both ovaries appear normal.   IMPRESSION: Positive for fetal demise.  No acute maternal findings.     Electronically Signed   By: Augusto GambleLee  Hall M.D.   On: 02/15/2014 14:43     Assessment:    Pregnancy: G1P0 Patient Active Problem List   Diagnosis Date Noted  . Nausea and vomiting in pregnancy prior to [redacted] weeks gestation 02/15/2014  . Fetal demise, less than 22 weeks 02/15/2014        Plan:     Initial labs drawn. Prenatal vitamins. Problem list reviewed and updated. Genetic Screening discussed First Screen: requested.   Ultrasound discussed; fetal survey: ordered.  Follow up in 4 weeks. 70% of 45 min visit spent on counseling and coordination of care.   Sent to US to assess viability. US showed Demise.  Discussed options. Undecided. Will call us    [facility]pl[facility] LLCWILLIAMS,MARIE 02/15/2014

## 2014-02-25 ENCOUNTER — Ambulatory Visit (INDEPENDENT_AMBULATORY_CARE_PROVIDER_SITE_OTHER): Payer: Self-pay | Admitting: Nurse Practitioner

## 2014-02-25 ENCOUNTER — Encounter: Payer: Self-pay | Admitting: Obstetrics & Gynecology

## 2014-02-25 VITALS — BP 108/62 | HR 61 | Temp 97.1°F | Ht 64.0 in | Wt 130.9 lb

## 2014-02-25 DIAGNOSIS — O039 Complete or unspecified spontaneous abortion without complication: Secondary | ICD-10-CM

## 2014-02-25 MED ORDER — OXYCODONE-ACETAMINOPHEN 5-325 MG PO TABS: 1.0000 | ORAL_TABLET | ORAL | Status: DC | PRN

## 2014-02-25 MED ORDER — PROMETHAZINE HCL 25 MG PO TABS: 25.0000 mg | ORAL_TABLET | Freq: Four times a day (QID) | ORAL | Status: DC | PRN

## 2014-02-25 MED ORDER — MISOPROSTOL 200 MCG PO TABS: 200.0000 ug | ORAL_TABLET | Freq: Four times a day (QID) | ORAL | Status: DC

## 2014-02-25 MED ORDER — NORGESTIMATE-ETH ESTRADIOL 0.25-35 MG-MCG PO TABS: 1.0000 | ORAL_TABLET | Freq: Every day | ORAL | Status: DC

## 2014-02-25 NOTE — Progress Notes (Signed)
History:  Toni BusmanKhaliah Call is a 23 y.o. G1P0 who presents to [facility] today for follow up on fetal demise. Her demise was at 8 weeks and 6 days. She was seen for her NOB on 02/15/14 and no heart beat was heard. She has been following expectant management and nothing pas passed. She has had light bleeding only. She is agreeable to Cytotec today and to start birth control pills after POC are passed. Dr Burnice LoganHarrawayKatrinka Blazing- Smith discussed this process at length.  The following portions of the patient's history were reviewed and updated as appropriate: allergies, current medications, past family history, past medical history, past social history, past surgical history and problem list.  Review of Systems:  Pertinent items are noted in HPI.  Objective:  Physical Exam BP 108/62  Pulse 61  Temp(Src) 97.1 F (36.2 C) (Oral)  Ht 5\' 4"  (1.626 m)  Wt 130 lb 14.4 oz (59.376 kg)  BMI 22.46 kg/m2  LMP 11/18/2013 GENERAL: Well-developed, well-nourished female in no acute distress.  HEENT: Normocephalic, atraumatic.  EXTREMITIES: No cyanosis, clubbing, or edema, 2+ distal pulses.   Labs and Imaging [place] Ob Comp Less 14 Wks  02/15/2014   CLINICAL DATA:  23 year old female with estimated gestational age of [redacted] weeks and 5 days by LMP. Unable to Doppler fetal heart tones. Initial encounter.  EXAM: OBSTETRIC <14 WK ULTRASOUND  TECHNIQUE: Transabdominal ultrasound was performed for evaluation of the gestation as well as the maternal uterus and adnexal regions.  COMPARISON:  None.  FINDINGS: Intrauterine gestational sac: Single  Yolk sac:  Not visible  Embryo:  Visible  Cardiac Activity: Not detected.  Negative M-mode Doppler for any evidence of fetal cardiac activity. Color Doppler also negative for any fetal vascularity (images 34 and 35).  Maternal uterus/adnexae: No subchorionic hemorrhage or pelvic free fluid. Both ovaries appear normal.  IMPRESSION: Positive for fetal demise.  No acute maternal findings.    Electronically Signed   By: Augusto GambleLee  Hall M.D.   On: 02/15/2014 14:43    Assessment & Plan:  Assessment:  A: Fetal demise  Plans:  Cytotec 200 mcg in vagina/ if POC are not passed will repeat dose Percocet/ phenergan for pain OrthoCyclen to start 1 week after POC are passed She will call Clinic or go to [facility] if any issues occur   Delbert PhenixLinda M Aquan Kope, NP 02/25/2014 10:17 AM

## 2014-02-25 NOTE — Patient Instructions (Signed)
Miscarriage A miscarriage is the sudden loss of an unborn baby (fetus) before the 20th week of pregnancy. Most miscarriages happen in the first 3 months of pregnancy. Sometimes, it happens before a woman even knows she is pregnant. A miscarriage is also called a "spontaneous miscarriage" or "early pregnancy loss." Having a miscarriage can be an emotional experience. Talk with your caregiver about any questions you may have about miscarrying, the grieving process, and your future pregnancy plans. CAUSES   Problems with the fetal chromosomes that make it impossible for the baby to develop normally. Problems with the baby's genes or chromosomes are most often the result of errors that occur, by chance, as the embryo divides and grows. The problems are not inherited from the parents.  Infection of the cervix or uterus.   Hormone problems.   Problems with the cervix, such as having an incompetent cervix. This is when the tissue in the cervix is not strong enough to hold the pregnancy.   Problems with the uterus, such as an abnormally shaped uterus, uterine fibroids, or congenital abnormalities.   Certain medical conditions.   Smoking, drinking alcohol, or taking illegal drugs.   Trauma.  Often, the cause of a miscarriage is unknown.  SYMPTOMS   Vaginal bleeding or spotting, with or without cramps or pain.  Pain or cramping in the abdomen or lower back.  Passing fluid, tissue, or blood clots from the vagina. DIAGNOSIS  Your caregiver will perform a physical exam. You may also have an ultrasound to confirm the miscarriage. Blood or urine tests may also be ordered. TREATMENT   Sometimes, treatment is not necessary if you naturally pass all the fetal tissue that was in the uterus. If some of the fetus or placenta remains in the body (incomplete miscarriage), tissue left behind may become infected and must be removed. Usually, a dilation and curettage (D and C) procedure is performed.  During a D and C procedure, the cervix is widened (dilated) and any remaining fetal or placental tissue is gently removed from the uterus.  Antibiotic medicines are prescribed if there is an infection. Other medicines may be given to reduce the size of the uterus (contract) if there is a lot of bleeding.  If you have Rh negative blood and your baby was Rh positive, you will need a Rh immunoglobulin shot. This shot will protect any future baby from having Rh blood problems in future pregnancies. HOME CARE INSTRUCTIONS   Your caregiver may order bed rest or may allow you to continue light activity. Resume activity as directed by your caregiver.  Have someone help with home and family responsibilities during this time.   Keep track of the number of sanitary pads you use each day and how soaked (saturated) they are. Write down this information.   Do not use tampons. Do not douche or have sexual intercourse until approved by your caregiver.   Only take over-the-counter or prescription medicines for pain or discomfort as directed by your caregiver.   Do not take aspirin. Aspirin can cause bleeding.   Keep all follow-up appointments with your caregiver.   If you or your partner have problems with grieving, talk to your caregiver or seek counseling to help cope with the pregnancy loss. Allow enough time to grieve before trying to get pregnant again.  SEEK IMMEDIATE MEDICAL CARE IF:   You have severe cramps or pain in your back or abdomen.  You have a fever.  You pass large blood clots (walnut-sized   or larger) ortissue from your vagina. Save any tissue for your caregiver to inspect.   Your bleeding increases.   You have a thick, bad-smelling vaginal discharge.  You become lightheaded, weak, or you faint.   You have chills.  MAKE SURE YOU:  Understand these instructions.  Will watch your condition.  Will get help right away if you are not doing well or get  worse. Document Released: 05/28/2001 Document Revised: 03/29/2013 Document Reviewed: 01/21/2012 [employer] Patient Information 2014 [employer].  

## 2014-03-17 ENCOUNTER — Ambulatory Visit: Payer: Self-pay | Admitting: Obstetrics and Gynecology

## 2014-07-22 ENCOUNTER — Encounter: Payer: Self-pay | Admitting: Obstetrics and Gynecology

## 2014-07-22 ENCOUNTER — Ambulatory Visit (INDEPENDENT_AMBULATORY_CARE_PROVIDER_SITE_OTHER): Payer: 59 | Admitting: Obstetrics and Gynecology

## 2014-07-22 VITALS — BP 119/94 | HR 70 | Wt 131.0 lb

## 2014-07-22 DIAGNOSIS — Z3480 Encounter for supervision of other normal pregnancy, unspecified trimester: Secondary | ICD-10-CM

## 2014-07-22 DIAGNOSIS — Z3490 Encounter for supervision of normal pregnancy, unspecified, unspecified trimester: Secondary | ICD-10-CM | POA: Insufficient documentation

## 2014-07-22 DIAGNOSIS — Z3491 Encounter for supervision of normal pregnancy, unspecified, first trimester: Secondary | ICD-10-CM

## 2014-07-22 DIAGNOSIS — Z113 Encounter for screening for infections with a predominantly sexual mode of transmission: Secondary | ICD-10-CM

## 2014-07-22 LAB — OB RESULTS CONSOLE GC/CHLAMYDIA
Chlamydia: NEGATIVE
Gonorrhea: NEGATIVE

## 2014-07-22 NOTE — Patient Instructions (Signed)
First Trimester of Pregnancy The first trimester of pregnancy is from week 1 until the end of week 12 (months 1 through 3). A week after a sperm fertilizes an egg, the egg will implant on the wall of the uterus. This embryo will begin to develop into a baby. Genes from you and your partner are forming the baby. The female genes determine whether the baby is a boy or a girl. At 6-8 weeks, the eyes and face are formed, and the heartbeat can be seen on ultrasound. At the end of 12 weeks, all the baby's organs are formed.  Now that you are pregnant, you will want to do everything you can to have a healthy baby. Two of the most important things are to get good prenatal care and to follow your health care provider's instructions. Prenatal care is all the medical care you receive before the baby's birth. This care will help prevent, find, and treat any problems during the pregnancy and childbirth. BODY CHANGES Your body goes through many changes during pregnancy. The changes vary from woman to woman.   You may gain or lose a couple of pounds at first.  You may feel sick to your stomach (nauseous) and throw up (vomit). If the vomiting is uncontrollable, call your health care provider.  You may tire easily.  You may develop headaches that can be relieved by medicines approved by your health care provider.  You may urinate more often. Painful urination may mean you have a bladder infection.  You may develop heartburn as a result of your pregnancy.  You may develop constipation because certain hormones are causing the muscles that push waste through your intestines to slow down.  You may develop hemorrhoids or swollen, bulging veins (varicose veins).  Your breasts may begin to grow larger and become tender. Your nipples may stick out more, and the tissue that surrounds them (areola) may become darker.  Your gums may bleed and may be sensitive to brushing and flossing.  Dark spots or blotches  (chloasma, mask of pregnancy) may develop on your face. This will likely fade after the baby is born.  Your menstrual periods will stop.  You may have a loss of appetite.  You may develop cravings for certain kinds of food.  You may have changes in your emotions from day to day, such as being excited to be pregnant or being concerned that something may go wrong with the pregnancy and baby.  You may have more vivid and strange dreams.  You may have changes in your hair. These can include thickening of your hair, rapid growth, and changes in texture. Some women also have hair loss during or after pregnancy, or hair that feels dry or thin. Your hair will most likely return to normal after your baby is born. WHAT TO EXPECT AT YOUR PRENATAL VISITS During a routine prenatal visit:  You will be weighed to make sure you and the baby are growing normally.  Your blood pressure will be taken.  Your abdomen will be measured to track your baby's growth.  The fetal heartbeat will be listened to starting around week 10 or 12 of your pregnancy.  Test results from any previous visits will be discussed. Your health care provider may ask you:  How you are feeling.  If you are feeling the baby move.  If you have had any abnormal symptoms, such as leaking fluid, bleeding, severe headaches, or abdominal cramping.  If you have any questions. Other tests   that may be performed during your first trimester include:  Blood tests to find your blood type and to check for the presence of any previous infections. They will also be used to check for low iron levels (anemia) and Rh antibodies. Later in the pregnancy, blood tests for diabetes will be done along with other tests if problems develop.  Urine tests to check for infections, diabetes, or protein in the urine.  An ultrasound to confirm the proper growth and development of the baby.  An amniocentesis to check for possible genetic problems.  Fetal  screens for spina bifida and Down syndrome.  You may need other tests to make sure you and the baby are doing well. HOME CARE INSTRUCTIONS  Medicines  Follow your health care provider's instructions regarding medicine use. Specific medicines may be either safe or unsafe to take during pregnancy.  Take your prenatal vitamins as directed.  If you develop constipation, try taking a stool softener if your health care provider approves. Diet  Eat regular, well-balanced meals. Choose a variety of foods, such as meat or vegetable-based protein, fish, milk and low-fat dairy products, vegetables, fruits, and whole grain breads and cereals. Your health care provider will help you determine the amount of weight gain that is right for you.  Avoid raw meat and uncooked cheese. These carry germs that can cause birth defects in the baby.  Eating four or five small meals rather than three large meals a day may help relieve nausea and vomiting. If you start to feel nauseous, eating a few soda crackers can be helpful. Drinking liquids between meals instead of during meals also seems to help nausea and vomiting.  If you develop constipation, eat more high-fiber foods, such as fresh vegetables or fruit and whole grains. Drink enough fluids to keep your urine clear or pale yellow. Activity and Exercise  Exercise only as directed by your health care provider. Exercising will help you:  Control your weight.  Stay in shape.  Be prepared for labor and delivery.  Experiencing pain or cramping in the lower abdomen or low back is a good sign that you should stop exercising. Check with your health care provider before continuing normal exercises.  Try to avoid standing for long periods of time. Move your legs often if you must stand in one place for a long time.  Avoid heavy lifting.  Wear low-heeled shoes, and practice good posture.  You may continue to have sex unless your health care provider directs you  otherwise. Relief of Pain or Discomfort  Wear a good support bra for breast tenderness.   Take warm sitz baths to soothe any pain or discomfort caused by hemorrhoids. Use hemorrhoid cream if your health care provider approves.   Rest with your legs elevated if you have leg cramps or low back pain.  If you develop varicose veins in your legs, wear support hose. Elevate your feet for 15 minutes, 3-4 times a day. Limit salt in your diet. Prenatal Care  Schedule your prenatal visits by the twelfth week of pregnancy. They are usually scheduled monthly at first, then more often in the last 2 months before delivery.  Write down your questions. Take them to your prenatal visits.  Keep all your prenatal visits as directed by your health care provider. Safety  Wear your seat belt at all times when driving.  Make a list of emergency phone numbers, including numbers for family, friends, the hospital, and police and fire departments. General Tips    Ask your health care provider for a referral to a local prenatal education class. Begin classes no later than at the beginning of month 6 of your pregnancy.  Ask for help if you have counseling or nutritional needs during pregnancy. Your health care provider can offer advice or refer you to specialists for help with various needs.  Do not use hot tubs, steam rooms, or saunas.  Do not douche or use tampons or scented sanitary pads.  Do not cross your legs for long periods of time.  Avoid cat litter boxes and soil used by cats. These carry germs that can cause birth defects in the baby and possibly loss of the fetus by miscarriage or stillbirth.  Avoid all smoking, herbs, alcohol, and medicines not prescribed by your health care provider. Chemicals in these affect the formation and growth of the baby.  Schedule a dentist appointment. At home, brush your teeth with a soft toothbrush and be gentle when you floss. SEEK MEDICAL CARE IF:   You have  dizziness.  You have mild pelvic cramps, pelvic pressure, or nagging pain in the abdominal area.  You have persistent nausea, vomiting, or diarrhea.  You have a bad smelling vaginal discharge.  You have pain with urination.  You notice increased swelling in your face, hands, legs, or ankles. SEEK IMMEDIATE MEDICAL CARE IF:   You have a fever.  You are leaking fluid from your vagina.  You have spotting or bleeding from your vagina.  You have severe abdominal cramping or pain.  You have rapid weight gain or loss.  You vomit blood or material that looks like coffee grounds.  You are exposed to German measles and have never had them.  You are exposed to fifth disease or chickenpox.  You develop a severe headache.  You have shortness of breath.  You have any kind of trauma, such as from a fall or a car accident. Document Released: 11/26/2001 Document Revised: 04/18/2014 Document Reviewed: 10/12/2013 [employer] Patient Information 2015 [employer]. This information is not intended to replace advice given to you by your health care provider. Make sure you discuss any questions you have with your health care provider.  Contraception Choices Contraception (birth control) is the use of any methods or devices to prevent pregnancy. Below are some methods to help avoid pregnancy. HORMONAL METHODS   Contraceptive implant. This is a thin, plastic tube containing progesterone hormone. It does not contain estrogen hormone. Your health care provider inserts the tube in the inner part of the upper arm. The tube can remain in place for up to 3 years. After 3 years, the implant must be removed. The implant prevents the ovaries from releasing an egg (ovulation), thickens the cervical mucus to prevent sperm from entering the uterus, and thins the lining of the inside of the uterus.  Progesterone-only injections. These injections are given every 3 months by your health care provider to prevent  pregnancy. This synthetic progesterone hormone stops the ovaries from releasing eggs. It also thickens cervical mucus and changes the uterine lining. This makes it harder for sperm to survive in the uterus.  Birth control pills. These pills contain estrogen and progesterone hormone. They work by preventing the ovaries from releasing eggs (ovulation). They also cause the cervical mucus to thicken, preventing the sperm from entering the uterus. Birth control pills are prescribed by a health care provider.Birth control pills can also be used to treat heavy periods.  Minipill. This type of birth control pill contains   only the progesterone hormone. They are taken every day of each month and must be prescribed by your health care provider.  Birth control patch. The patch contains hormones similar to those in birth control pills. It must be changed once a week and is prescribed by a health care provider.  Vaginal ring. The ring contains hormones similar to those in birth control pills. It is left in the vagina for 3 weeks, removed for 1 week, and then a new one is put back in place. The patient must be comfortable inserting and removing the ring from the vagina.A health care provider's prescription is necessary.  Emergency contraception. Emergency contraceptives prevent pregnancy after unprotected sexual intercourse. This pill can be taken right after sex or up to 5 days after unprotected sex. It is most effective the sooner you take the pills after having sexual intercourse. Most emergency contraceptive pills are available without a prescription. Check with your pharmacist. Do not use emergency contraception as your only form of birth control. BARRIER METHODS   Female condom. This is a thin sheath (latex or rubber) that is worn over the penis during sexual intercourse. It can be used with spermicide to increase effectiveness.  Female condom. This is a soft, loose-fitting sheath that is put into the vagina  before sexual intercourse.  Diaphragm. This is a soft, latex, dome-shaped barrier that must be fitted by a health care provider. It is inserted into the vagina, along with a spermicidal jelly. It is inserted before intercourse. The diaphragm should be left in the vagina for 6 to 8 hours after intercourse.  Cervical cap. This is a round, soft, latex or plastic cup that fits over the cervix and must be fitted by a health care provider. The cap can be left in place for up to 48 hours after intercourse.  Sponge. This is a soft, circular piece of polyurethane foam. The sponge has spermicide in it. It is inserted into the vagina after wetting it and before sexual intercourse.  Spermicides. These are chemicals that kill or block sperm from entering the cervix and uterus. They come in the form of creams, jellies, suppositories, foam, or tablets. They do not require a prescription. They are inserted into the vagina with an applicator before having sexual intercourse. The process must be repeated every time you have sexual intercourse. INTRAUTERINE CONTRACEPTION  Intrauterine device (IUD). This is a T-shaped device that is put in a woman's uterus during a menstrual period to prevent pregnancy. There are 2 types:  Copper IUD. This type of IUD is wrapped in copper wire and is placed inside the uterus. Copper makes the uterus and fallopian tubes produce a fluid that kills sperm. It can stay in place for 10 years.  Hormone IUD. This type of IUD contains the hormone progestin (synthetic progesterone). The hormone thickens the cervical mucus and prevents sperm from entering the uterus, and it also thins the uterine lining to prevent implantation of a fertilized egg. The hormone can weaken or kill the sperm that get into the uterus. It can stay in place for 3-5 years, depending on which type of IUD is used. PERMANENT METHODS OF CONTRACEPTION  Female tubal ligation. This is when the woman's fallopian tubes are  surgically sealed, tied, or blocked to prevent the egg from traveling to the uterus.  Hysteroscopic sterilization. This involves placing a small coil or insert into each fallopian tube. Your doctor uses a technique called hysteroscopy to do the procedure. The device causes scar tissue   to form. This results in permanent blockage of the fallopian tubes, so the sperm cannot fertilize the egg. It takes about 3 months after the procedure for the tubes to become blocked. You must use another form of birth control for these 3 months.  Female sterilization. This is when the female has the tubes that carry sperm tied off (vasectomy).This blocks sperm from entering the vagina during sexual intercourse. After the procedure, the man can still ejaculate fluid (semen). NATURAL PLANNING METHODS  Natural family planning. This is not having sexual intercourse or using a barrier method (condom, diaphragm, cervical cap) on days the woman could become pregnant.  Calendar method. This is keeping track of the length of each menstrual cycle and identifying when you are fertile.  Ovulation method. This is avoiding sexual intercourse during ovulation.  Symptothermal method. This is avoiding sexual intercourse during ovulation, using a thermometer and ovulation symptoms.  Post-ovulation method. This is timing sexual intercourse after you have ovulated. Regardless of which type or method of contraception you choose, it is important that you use condoms to protect against the transmission of sexually transmitted infections (STIs). Talk with your health care provider about which form of contraception is most appropriate for you. Document Released: 12/02/2005 Document Revised: 12/07/2013 Document Reviewed: 05/27/2013 [employer] Patient Information 2015 [employer]. This information is not intended to replace advice given to you by your health care provider. Make sure you discuss any questions you have with your health care  provider.  Breastfeeding Deciding to breastfeed is one of the best choices you can make for you and your baby. A change in hormones during pregnancy causes your breast tissue to grow and increases the number and size of your milk ducts. These hormones also allow proteins, sugars, and fats from your blood supply to make breast milk in your milk-producing glands. Hormones prevent breast milk from being released before your baby is born as well as prompt milk flow after birth. Once breastfeeding has begun, thoughts of your baby, as well as his or her sucking or crying, can stimulate the release of milk from your milk-producing glands.  BENEFITS OF BREASTFEEDING For Your Baby  Your first milk (colostrum) helps your baby's digestive system function better.   There are antibodies in your milk that help your baby fight off infections.   Your baby has a lower incidence of asthma, allergies, and sudden infant death syndrome.   The nutrients in breast milk are better for your baby than infant formulas and are designed uniquely for your baby's needs.   Breast milk improves your baby's brain development.   Your baby is less likely to develop other conditions, such as childhood obesity, asthma, or type 2 diabetes mellitus.  For You   Breastfeeding helps to create a very special bond between you and your baby.   Breastfeeding is convenient. Breast milk is always available at the correct temperature and costs nothing.   Breastfeeding helps to burn calories and helps you lose the weight gained during pregnancy.   Breastfeeding makes your uterus contract to its prepregnancy size faster and slows bleeding (lochia) after you give birth.   Breastfeeding helps to lower your risk of developing type 2 diabetes mellitus, osteoporosis, and breast or ovarian cancer later in life. SIGNS THAT YOUR BABY IS HUNGRY Early Signs of Hunger  Increased alertness or activity.  Stretching.  Movement of the  head from side to side.  Movement of the head and opening of the mouth when the corner   of the mouth or cheek is stroked (rooting).  Increased sucking sounds, smacking lips, cooing, sighing, or squeaking.  Hand-to-mouth movements.  Increased sucking of fingers or hands. Late Signs of Hunger  Fussing.  Intermittent crying. Extreme Signs of Hunger Signs of extreme hunger will require calming and consoling before your baby will be able to breastfeed successfully. Do not wait for the following signs of extreme hunger to occur before you initiate breastfeeding:   Restlessness.  A loud, strong cry.   Screaming. BREASTFEEDING BASICS Breastfeeding Initiation  Find a comfortable place to sit or lie down, with your neck and back well supported.  Place a pillow or rolled up blanket under your baby to bring him or her to the level of your breast (if you are seated). Nursing pillows are specially designed to help support your arms and your baby while you breastfeed.  Make sure that your baby's abdomen is facing your abdomen.   Gently massage your breast. With your fingertips, massage from your chest wall toward your nipple in a circular motion. This encourages milk flow. You may need to continue this action during the feeding if your milk flows slowly.  Support your breast with 4 fingers underneath and your thumb above your nipple. Make sure your fingers are well away from your nipple and your baby's mouth.   Stroke your baby's lips gently with your finger or nipple.   When your baby's mouth is open wide enough, quickly bring your baby to your breast, placing your entire nipple and as much of the colored area around your nipple (areola) as possible into your baby's mouth.   More areola should be visible above your baby's upper lip than below the lower lip.   Your baby's tongue should be between his or her lower gum and your breast.   Ensure that your baby's mouth is correctly  positioned around your nipple (latched). Your baby's lips should create a seal on your breast and be turned out (everted).  It is common for your baby to suck about 2-3 minutes in order to start the flow of breast milk. Latching Teaching your baby how to latch on to your breast properly is very important. An improper latch can cause nipple pain and decreased milk supply for you and poor weight gain in your baby. Also, if your baby is not latched onto your nipple properly, he or she may swallow some air during feeding. This can make your baby fussy. Burping your baby when you switch breasts during the feeding can help to get rid of the air. However, teaching your baby to latch on properly is still the best way to prevent fussiness from swallowing air while breastfeeding. Signs that your baby has successfully latched on to your nipple:    Silent tugging or silent sucking, without causing you pain.   Swallowing heard between every 3-4 sucks.    Muscle movement above and in front of his or her ears while sucking.  Signs that your baby has not successfully latched on to nipple:   Sucking sounds or smacking sounds from your baby while breastfeeding.  Nipple pain. If you think your baby has not latched on correctly, slip your finger into the corner of your baby's mouth to break the suction and place it between your baby's gums. Attempt breastfeeding initiation again. Signs of Successful Breastfeeding Signs from your baby:   A gradual decrease in the number of sucks or complete cessation of sucking.   Falling asleep.     Relaxation of his or her body.   Retention of a small amount of milk in his or her mouth.   Letting go of your breast by himself or herself. Signs from you:  Breasts that have increased in firmness, weight, and size 1-3 hours after feeding.   Breasts that are softer immediately after breastfeeding.  Increased milk volume, as well as a change in milk consistency  and color by the fifth day of breastfeeding.   Nipples that are not sore, cracked, or bleeding. Signs That Your Baby is Getting Enough Milk  Wetting at least 3 diapers in a 24-hour period. The urine should be clear and pale yellow by age 5 days.  At least 3 stools in a 24-hour period by age 5 days. The stool should be soft and yellow.  At least 3 stools in a 24-hour period by age 7 days. The stool should be seedy and yellow.  No loss of weight greater than 10% of birth weight during the first 3 days of age.  Average weight gain of 4-7 ounces (113-198 g) per week after age 4 days.  Consistent daily weight gain by age 5 days, without weight loss after the age of 2 weeks. After a feeding, your baby may spit up a small amount. This is common. BREASTFEEDING FREQUENCY AND DURATION Frequent feeding will help you make more milk and can prevent sore nipples and breast engorgement. Breastfeed when you feel the need to reduce the fullness of your breasts or when your baby shows signs of hunger. This is called "breastfeeding on demand." Avoid introducing a pacifier to your baby while you are working to establish breastfeeding (the first 4-6 weeks after your baby is born). After this time you may choose to use a pacifier. Research has shown that pacifier use during the first year of a baby's life decreases the risk of sudden infant death syndrome (SIDS). Allow your baby to feed on each breast as long as he or she wants. Breastfeed until your baby is finished feeding. When your baby unlatches or falls asleep while feeding from the first breast, offer the second breast. Because newborns are often sleepy in the first few weeks of life, you may need to awaken your baby to get him or her to feed. Breastfeeding times will vary from baby to baby. However, the following rules can serve as a guide to help you ensure that your baby is properly fed:  Newborns (babies 4 weeks of age or younger) may breastfeed every  1-3 hours.  Newborns should not go longer than 3 hours during the day or 5 hours during the night without breastfeeding.  You should breastfeed your baby a minimum of 8 times in a 24-hour period until you begin to introduce solid foods to your baby at around 6 months of age. BREAST MILK PUMPING Pumping and storing breast milk allows you to ensure that your baby is exclusively fed your breast milk, even at times when you are unable to breastfeed. This is especially important if you are going back to work while you are still breastfeeding or when you are not able to be present during feedings. Your lactation consultant can give you guidelines on how long it is safe to store breast milk.  A breast pump is a machine that allows you to pump milk from your breast into a sterile bottle. The pumped breast milk can then be stored in a refrigerator or freezer. Some breast pumps are operated by hand, while others   use electricity. Ask your lactation consultant which type will work best for you. Breast pumps can be purchased, but some hospitals and breastfeeding support groups lease breast pumps on a monthly basis. A lactation consultant can teach you how to hand express breast milk, if you prefer not to use a pump.  CARING FOR YOUR BREASTS WHILE YOU BREASTFEED Nipples can become dry, cracked, and sore while breastfeeding. The following recommendations can help keep your breasts moisturized and healthy:  Avoid using soap on your nipples.   Wear a supportive bra. Although not required, special nursing bras and tank tops are designed to allow access to your breasts for breastfeeding without taking off your entire bra or top. Avoid wearing underwire-style bras or extremely tight bras.  Air dry your nipples for 3-4minutes after each feeding.   Use only cotton bra pads to absorb leaked breast milk. Leaking of breast milk between feedings is normal.   Use lanolin on your nipples after breastfeeding. Lanolin  helps to maintain your skin's normal moisture barrier. If you use pure lanolin, you do not need to wash it off before feeding your baby again. Pure lanolin is not toxic to your baby. You may also hand express a few drops of breast milk and gently massage that milk into your nipples and allow the milk to air dry. In the first few weeks after giving birth, some women experience extremely full breasts (engorgement). Engorgement can make your breasts feel heavy, warm, and tender to the touch. Engorgement peaks within 3-5 days after you give birth. The following recommendations can help ease engorgement:  Completely empty your breasts while breastfeeding or pumping. You may want to start by applying warm, moist heat (in the shower or with warm water-soaked hand towels) just before feeding or pumping. This increases circulation and helps the milk flow. If your baby does not completely empty your breasts while breastfeeding, pump any extra milk after he or she is finished.  Wear a snug bra (nursing or regular) or tank top for 1-2 days to signal your body to slightly decrease milk production.  Apply ice packs to your breasts, unless this is too uncomfortable for you.  Make sure that your baby is latched on and positioned properly while breastfeeding. If engorgement persists after 48 hours of following these recommendations, contact your health care provider or a lactation consultant. OVERALL HEALTH CARE RECOMMENDATIONS WHILE BREASTFEEDING  Eat healthy foods. Alternate between meals and snacks, eating 3 of each per day. Because what you eat affects your breast milk, some of the foods may make your baby more irritable than usual. Avoid eating these foods if you are sure that they are negatively affecting your baby.  Drink milk, fruit juice, and water to satisfy your thirst (about 10 glasses a day).   Rest often, relax, and continue to take your prenatal vitamins to prevent fatigue, stress, and  anemia.  Continue breast self-awareness checks.  Avoid chewing and smoking tobacco.  Avoid alcohol and drug use. Some medicines that may be harmful to your baby can pass through breast milk. It is important to ask your health care provider before taking any medicine, including all over-the-counter and prescription medicine as well as vitamin and herbal supplements. It is possible to become pregnant while breastfeeding. If birth control is desired, ask your health care provider about options that will be safe for your baby. SEEK MEDICAL CARE IF:   You feel like you want to stop breastfeeding or have become frustrated with breastfeeding.    You have painful breasts or nipples.  Your nipples are cracked or bleeding.  Your breasts are red, tender, or warm.  You have a swollen area on either breast.  You have a fever or chills.  You have nausea or vomiting.  You have drainage other than breast milk from your nipples.  Your breasts do not become full before feedings by the fifth day after you give birth.  You feel sad and depressed.  Your baby is too sleepy to eat well.  Your baby is having trouble sleeping.   Your baby is wetting less than 3 diapers in a 24-hour period.  Your baby has less than 3 stools in a 24-hour period.  Your baby's skin or the white part of his or her eyes becomes yellow.   Your baby is not gaining weight by 5 days of age. SEEK IMMEDIATE MEDICAL CARE IF:   Your baby is overly tired (lethargic) and does not want to wake up and feed.  Your baby develops an unexplained fever. Document Released: 12/02/2005 Document Revised: 12/07/2013 Document Reviewed: 05/26/2013 ExitCare Patient Information 2015 [employer]. This information is not intended to replace advice given to you by your health care provider. Make sure you discuss any questions you have with your health care provider.  

## 2014-07-22 NOTE — Progress Notes (Signed)
   Subjective:    Toni Romero is a G2P0 8486w3d being seen today for her first obstetrical visit.  Her obstetrical history is significant for first trimester miscarriage. Patient does intend to breast feed. Pregnancy history fully reviewed.  Patient reports nausea.  There were no vitals filed for this visit.  HISTORY: OB History  Gravida Para Term Preterm AB SAB TAB Ectopic Multiple Living  2             # Outcome Date GA Lbr Len/2nd Weight Sex Delivery Anes PTL Lv  2 CUR           1 GRA              Comments: System Generated. Please review and update pregnancy details.     Past Medical History  Diagnosis Date  . Medical history non-contributory    Past Surgical History  Procedure Laterality Date  . No past surgeries     No family history on file.   Exam    Uterus:     Pelvic Exam:    Perineum: Normal Perineum   Vulva: normal   Vagina:  normal mucosa, normal discharge   pH:    Cervix: closed and long   Adnexa: normal adnexa and no mass, fullness, tenderness   Bony Pelvis: android  System: Breast:  normal appearance, no masses or tenderness   Skin: normal coloration and turgor, no rashes    Neurologic: oriented, no focal deficits   Extremities: normal strength, tone, and muscle mass   HEENT extra ocular movement intact   Mouth/Teeth mucous membranes moist, pharynx normal without lesions and dental hygiene good   Neck supple and no masses   Cardiovascular: regular rate and rhythm   Respiratory:  chest clear, no wheezing, crepitations, rhonchi, normal symmetric air entry   Abdomen: soft, non-tender; bowel sounds normal; no masses,  no organomegaly   Urinary:       Assessment:    Pregnancy: G2P0 There are no active problems to display for this patient.       Plan:     Initial labs drawn. Prenatal vitamins. Problem list reviewed and updated. Genetic Screening discussed First Screen: requested.  Ultrasound discussed; fetal survey: requested.  Follow up in 4 weeks. 50% of 30 min visit spent on counseling and coordination of care.     Phung Kotas 07/22/2014

## 2014-07-23 LAB — OBSTETRIC PANEL
Antibody Screen: NEGATIVE
BASOS PCT: 0 % (ref 0–1)
Basophils Absolute: 0 10*3/uL (ref 0.0–0.1)
EOS ABS: 0.1 10*3/uL (ref 0.0–0.7)
EOS PCT: 1 % (ref 0–5)
HCT: 34.1 % — ABNORMAL LOW (ref 36.0–46.0)
HEP B S AG: NEGATIVE
Hemoglobin: 12.1 g/dL (ref 12.0–15.0)
Lymphocytes Relative: 36 % (ref 12–46)
Lymphs Abs: 2 10*3/uL (ref 0.7–4.0)
MCH: 31.2 pg (ref 26.0–34.0)
MCHC: 35.5 g/dL (ref 30.0–36.0)
MCV: 87.9 fL (ref 78.0–100.0)
Monocytes Absolute: 0.2 10*3/uL (ref 0.1–1.0)
Monocytes Relative: 4 % (ref 3–12)
NEUTROS PCT: 59 % (ref 43–77)
Neutro Abs: 3.3 10*3/uL (ref 1.7–7.7)
PLATELETS: 277 10*3/uL (ref 150–400)
RBC: 3.88 MIL/uL (ref 3.87–5.11)
RDW: 14.2 % (ref 11.5–15.5)
Rh Type: POSITIVE
Rubella: 2.31 Index — ABNORMAL HIGH (ref <0.90)
WBC: 5.6 10*3/uL (ref 4.0–10.5)

## 2014-07-23 LAB — HIV ANTIBODY (ROUTINE TESTING W REFLEX): HIV: NONREACTIVE

## 2014-07-24 LAB — CULTURE, OB URINE
Colony Count: NO GROWTH
ORGANISM ID, BACTERIA: NO GROWTH

## 2014-07-25 LAB — GC/CHLAMYDIA PROBE AMP
CT Probe RNA: NEGATIVE
GC PROBE AMP APTIMA: NEGATIVE

## 2014-08-14 ENCOUNTER — Encounter (HOSPITAL_COMMUNITY): Payer: Self-pay | Admitting: *Deleted

## 2014-08-14 ENCOUNTER — Inpatient Hospital Stay (HOSPITAL_COMMUNITY)
Admission: AD | Admit: 2014-08-14 | Discharge: 2014-08-14 | Disposition: A | Discharge status: Home / Self Care | Payer: 59 | Source: Ambulatory Visit | Attending: Obstetrics & Gynecology | Admitting: Obstetrics & Gynecology

## 2014-08-14 DIAGNOSIS — R079 Chest pain, unspecified: Secondary | ICD-10-CM | POA: Insufficient documentation

## 2014-08-14 DIAGNOSIS — O9989 Other specified diseases and conditions complicating pregnancy, childbirth and the puerperium: Secondary | ICD-10-CM

## 2014-08-14 DIAGNOSIS — O210 Mild hyperemesis gravidarum: Secondary | ICD-10-CM | POA: Diagnosis not present

## 2014-08-14 DIAGNOSIS — O99891 Other specified diseases and conditions complicating pregnancy: Secondary | ICD-10-CM | POA: Diagnosis not present

## 2014-08-14 DIAGNOSIS — O219 Vomiting of pregnancy, unspecified: Secondary | ICD-10-CM

## 2014-08-14 DIAGNOSIS — K219 Gastro-esophageal reflux disease without esophagitis: Secondary | ICD-10-CM | POA: Diagnosis not present

## 2014-08-14 LAB — URINALYSIS, ROUTINE W REFLEX MICROSCOPIC
Glucose, UA: NEGATIVE mg/dL
Ketones, ur: >80 mg/dL — AB
LEUKOCYTES UA: NEGATIVE
Nitrite: NEGATIVE
PROTEIN: NEGATIVE mg/dL
Specific Gravity, Urine: 1.02 (ref 1.005–1.030)
Urobilinogen, UA: 1 mg/dL (ref 0.0–1.0)
pH: 6 (ref 5.0–8.0)

## 2014-08-14 LAB — URINE MICROSCOPIC-ADD ON

## 2014-08-14 MED ORDER — PROMETHAZINE HCL 25 MG PO TABS: 12.5000 mg | ORAL_TABLET | Freq: Four times a day (QID) | ORAL | Status: DC | PRN

## 2014-08-14 MED ORDER — PROMETHAZINE HCL 25 MG PO TABS: 25.0000 mg | ORAL_TABLET | Freq: Once | ORAL | Status: DC

## 2014-08-14 MED ORDER — DEXTROSE 5 % IN LACTATED RINGERS IV BOLUS
1000.0000 mL | Freq: Once | INTRAVENOUS | Status: AC
  Administered 2014-08-14: 1000 mL via INTRAVENOUS

## 2014-08-14 MED ORDER — PROMETHAZINE HCL 25 MG/ML IJ SOLN
25.0000 mg | Freq: Once | INTRAMUSCULAR | Status: AC
  Administered 2014-08-14: 25 mg via INTRAVENOUS
  Filled 2014-08-14: qty 1

## 2014-08-14 MED ORDER — GI COCKTAIL ~~LOC~~
30.0000 mL | Freq: Once | ORAL | Status: AC
  Administered 2014-08-14: 30 mL via ORAL
  Filled 2014-08-14: qty 30

## 2014-08-14 NOTE — MAU Provider Note (Signed)
History     CSN: [id number]  Arrival date and time: 08/14/14 2017   First Provider Initiated Contact with Patient 08/14/14 2043      Chief Complaint  Patient presents with  . Chest Pain  . Emesis During Pregnancy   HPI  Toni Romero is a 23 y.o. G2P0010 at [redacted]w[redacted]d who presents today with 8/10 epigastric and throat pain for over a week. She has been taking zofran and pepcid, and it isn't helping. She states that she has been vomiting about 5-6 times per day.   Past Medical History  Diagnosis Date  . Medical history non-contributory     Past Surgical History  Procedure Laterality Date  . No past surgeries      History reviewed. No pertinent family history.  History  Substance Use Topics  . Smoking status: Never Smoker   . Smokeless tobacco: Never Used  . Alcohol Use: No    Allergies: No Known Allergies  Prescriptions prior to admission  Medication Sig Dispense Refill  . Prenatal Vit-Min-FA-Fish Oil ([facility] PRENATAL GUMMY) 0.4-113.5 MG CHEW Chew by mouth.        ROS Physical Exam   Blood pressure 109/88, pulse 79, temperature 98 F (36.7 C), temperature source Oral, resp. rate 18, height  (1.626 m), weight 55.339 kg (122 lb), last menstrual period 06/07/2014, SpO2 100.00%, unknown if currently breastfeeding.  Physical Exam  Nursing note and vitals reviewed. Constitutional: She is oriented to person, place, and time. She appears well-developed and well-nourished. No distress.  Cardiovascular: Normal rate.   Respiratory: Effort normal.  GI: Soft. There is no tenderness. There is no rebound.  Neurological: She is alert and oriented to person, place, and time.  Skin: Skin is warm and dry.  Psychiatric: She has a normal mood and affect.    MAU Course  Procedures  Results for orders placed during the hospital encounter of 08/14/14 (from the past 24 hour(s))  URINALYSIS, ROUTINE W REFLEX MICROSCOPIC     Status: Abnormal   Collection Time    08/14/14  8:28  PM      Result Value Ref Range   Color, Urine YELLOW  YELLOW   APPearance CLEAR  CLEAR   Specific Gravity, Urine 1.020  1.005 - 1.030   pH 6.0  5.0 - 8.0   Glucose, UA NEGATIVE  NEGATIVE mg/dL   Hgb urine dipstick TRACE (*) NEGATIVE   Bilirubin Urine SMALL (*) NEGATIVE   Ketones, ur >80 (*) NEGATIVE mg/dL   Protein, ur NEGATIVE  NEGATIVE mg/dL   Urobilinogen, UA 1.0  0.0 - 1.0 mg/dL   Nitrite NEGATIVE  NEGATIVE   Leukocytes, UA NEGATIVE  NEGATIVE  URINE MICROSCOPIC-ADD ON     Status: Abnormal   Collection Time    08/14/14  8:28 PM      Result Value Ref Range   Squamous Epithelial / LPF FEW (*) RARE   WBC, UA 0-2  <3 WBC/hpf   RBC / HPF 0-2  <3 RBC/hpf   Bacteria, UA RARE  RARE   Urine-Other MUCOUS PRESENT      2221: Patient has had phenergan and GI cocktail. She has not vomited since being here. Her pain is also a 3/10 down from 8/10.  Assessment and Plan   1. Nausea/vomiting in pregnancy   2. GERD without esophagitis    Rx phenergan Continue pepcid and zofran PRN  Follow-up Information   Follow up with [facility] [place]. (As scheduled)  Specialty:  Obstetrics and Gynecology   Contact information:   [address] [place] [address] [phone]       Tawnya Crook 08/14/2014, 8:45 PM

## 2014-08-14 NOTE — MAU Note (Signed)
Pt reports she has had extreme chest pain for the last 3 days, states it starts at the base of her throat and goes down to mid chest. States she has been taking generic pepcid because she thought it was heartburn but the medicine has not helped. Has also had vomiting everyday with the pregnancy.

## 2014-08-14 NOTE — Discharge Instructions (Signed)

## 2014-08-16 ENCOUNTER — Encounter (HOSPITAL_COMMUNITY): Payer: Self-pay | Admitting: *Deleted

## 2014-08-16 ENCOUNTER — Inpatient Hospital Stay (HOSPITAL_COMMUNITY)
Admission: AD | Admit: 2014-08-16 | Discharge: 2014-08-16 | Disposition: A | Discharge status: Home / Self Care | Payer: 59 | Source: Ambulatory Visit | Attending: Obstetrics and Gynecology | Admitting: Obstetrics and Gynecology

## 2014-08-16 DIAGNOSIS — O99891 Other specified diseases and conditions complicating pregnancy: Secondary | ICD-10-CM | POA: Diagnosis not present

## 2014-08-16 DIAGNOSIS — O210 Mild hyperemesis gravidarum: Secondary | ICD-10-CM | POA: Insufficient documentation

## 2014-08-16 DIAGNOSIS — R070 Pain in throat: Secondary | ICD-10-CM | POA: Diagnosis present

## 2014-08-16 DIAGNOSIS — K219 Gastro-esophageal reflux disease without esophagitis: Secondary | ICD-10-CM | POA: Diagnosis not present

## 2014-08-16 DIAGNOSIS — O9989 Other specified diseases and conditions complicating pregnancy, childbirth and the puerperium: Principal | ICD-10-CM

## 2014-08-16 LAB — URINALYSIS, ROUTINE W REFLEX MICROSCOPIC
Glucose, UA: NEGATIVE mg/dL
Ketones, ur: >80 mg/dL — AB
LEUKOCYTES UA: NEGATIVE
NITRITE: NEGATIVE
PROTEIN: 30 mg/dL — AB
Urobilinogen, UA: 0.2 mg/dL (ref 0.0–1.0)
pH: 6 (ref 5.0–8.0)

## 2014-08-16 LAB — URINE MICROSCOPIC-ADD ON

## 2014-08-16 MED ORDER — PANTOPRAZOLE SODIUM 20 MG PO TBEC: 20.0000 mg | DELAYED_RELEASE_TABLET | Freq: Two times a day (BID) | ORAL | Status: DC

## 2014-08-16 MED ORDER — GI COCKTAIL ~~LOC~~
30.0000 mL | Freq: Once | ORAL | Status: AC
  Administered 2014-08-16: 30 mL via ORAL
  Filled 2014-08-16: qty 30

## 2014-08-16 NOTE — Discharge Instructions (Signed)
Second Trimester of Pregnancy The second trimester is from week 13 through week 28, months 4 through 6. The second trimester is often a time when you feel your best. Your body has also adjusted to being pregnant, and you begin to feel better physically. Usually, morning sickness has lessened or quit completely, you may have more energy, and you may have an increase in appetite. The second trimester is also a time when the fetus is growing rapidly. At the end of the sixth month, the fetus is about 9 inches long and weighs about 1 pounds. You will likely begin to feel the baby move (quickening) between 18 and 20 weeks of the pregnancy. BODY CHANGES Your body goes through many changes during pregnancy. The changes vary from woman to woman.   Your weight will continue to increase. You will notice your lower abdomen bulging out.  You may begin to get stretch marks on your hips, abdomen, and breasts.  You may develop headaches that can be relieved by medicines approved by your health care provider.  You may urinate more often because the fetus is pressing on your bladder.  You may develop or continue to have heartburn as a result of your pregnancy.  You may develop constipation because certain hormones are causing the muscles that push waste through your intestines to slow down.  You may develop hemorrhoids or swollen, bulging veins (varicose veins).  You may have back pain because of the weight gain and pregnancy hormones relaxing your joints between the bones in your pelvis and as a result of a shift in weight and the muscles that support your balance.  Your breasts will continue to grow and be tender.  Your gums may bleed and may be sensitive to brushing and flossing.  Dark spots or blotches (chloasma, mask of pregnancy) may develop on your face. This will likely fade after the baby is born.  A dark line from your belly button to the pubic area (linea nigra) may appear. This will likely fade  after the baby is born.  You may have changes in your hair. These can include thickening of your hair, rapid growth, and changes in texture. Some women also have hair loss during or after pregnancy, or hair that feels dry or thin. Your hair will most likely return to normal after your baby is born. WHAT TO EXPECT AT YOUR PRENATAL VISITS During a routine prenatal visit:  You will be weighed to make sure you and the fetus are growing normally.  Your blood pressure will be taken.  Your abdomen will be measured to track your baby's growth.  The fetal heartbeat will be listened to.  Any test results from the previous visit will be discussed. Your health care provider may ask you:  How you are feeling.  If you are feeling the baby move.  If you have had any abnormal symptoms, such as leaking fluid, bleeding, severe headaches, or abdominal cramping.  If you have any questions. Other tests that may be performed during your second trimester include:  Blood tests that check for:  Low iron levels (anemia).  Gestational diabetes (between 24 and 28 weeks).  Rh antibodies.  Urine tests to check for infections, diabetes, or protein in the urine.  An ultrasound to confirm the proper growth and development of the baby.  An amniocentesis to check for possible genetic problems.  Fetal screens for spina bifida and Down syndrome. HOME CARE INSTRUCTIONS   Avoid all smoking, herbs, alcohol, and unprescribed   drugs. These chemicals affect the formation and growth of the baby.  Follow your health care provider's instructions regarding medicine use. There are medicines that are either safe or unsafe to take during pregnancy.  Exercise only as directed by your health care provider. Experiencing uterine cramps is a good sign to stop exercising.  Continue to eat regular, healthy meals.  Wear a good support bra for breast tenderness.  Do not use hot tubs, steam rooms, or saunas.  Wear your  seat belt at all times when driving.  Avoid raw meat, uncooked cheese, cat litter boxes, and soil used by cats. These carry germs that can cause birth defects in the baby.  Take your prenatal vitamins.  Try taking a stool softener (if your health care provider approves) if you develop constipation. Eat more high-fiber foods, such as fresh vegetables or fruit and whole grains. Drink plenty of fluids to keep your urine clear or pale yellow.  Take warm sitz baths to soothe any pain or discomfort caused by hemorrhoids. Use hemorrhoid cream if your health care provider approves.  If you develop varicose veins, wear support hose. Elevate your feet for 15 minutes, 3-4 times a day. Limit salt in your diet.  Avoid heavy lifting, wear low heel shoes, and practice good posture.  Rest with your legs elevated if you have leg cramps or low back pain.  Visit your dentist if you have not gone yet during your pregnancy. Use a soft toothbrush to brush your teeth and be gentle when you floss.  A sexual relationship may be continued unless your health care provider directs you otherwise.  Continue to go to all your prenatal visits as directed by your health care provider. SEEK MEDICAL CARE IF:   You have dizziness.  You have mild pelvic cramps, pelvic pressure, or nagging pain in the abdominal area.  You have persistent nausea, vomiting, or diarrhea.  You have a bad smelling vaginal discharge.  You have pain with urination. SEEK IMMEDIATE MEDICAL CARE IF:   You have a fever.  You are leaking fluid from your vagina.  You have spotting or bleeding from your vagina.  You have severe abdominal cramping or pain.  You have rapid weight gain or loss.  You have shortness of breath with chest pain.  You notice sudden or extreme swelling of your face, hands, ankles, feet, or legs.  You have not felt your baby move in over an hour.  You have severe headaches that do not go away with  medicine.  You have vision changes. Document Released: 11/26/2001 Document Revised: 12/07/2013 Document Reviewed: 02/02/2013 ExitCare Patient Information 2015 [employer]. This information is not intended to replace advice given to you by your health care provider. Make sure you discuss any questions you have with your health care provider.  

## 2014-08-16 NOTE — MAU Note (Signed)
PT  SAYS SHE WAS HERE BEFORE  FOR VOMITING .  SAYS VOMITING    MAKES  HER THROAT BURN.     TOOK PHENERGAN  AT   0240.  NO ZOFRAN.

## 2014-08-16 NOTE — MAU Provider Note (Signed)
Attestation of Attending Supervision of Advanced Practitioner (CNM/NP): Evaluation and management procedures were performed by the Advanced Practitioner under my supervision and collaboration.  I have reviewed the Advanced Practitioner's note and chart, and I agree with the management and plan.  Maryrose Colvin 08/16/2014 7:36 AM   

## 2014-08-16 NOTE — MAU Provider Note (Signed)
History     CSN: [id number]  Arrival date and time: 08/16/14 8295   First Provider Initiated Contact with Patient 08/16/14 [phone]      No chief complaint on file.  HPI  Toni Romero is a 23 y.o. G2P0010 at [redacted]w[redacted]d who presents today with continued throat pain. She states that she has been taking the phenergan, and it has helped with the nausea and vomiting. However, she still has the throat pain. She has not been taking the pepcid anymore because she thinks it does not help.   Past Medical History  Diagnosis Date  . Medical history non-contributory     Past Surgical History  Procedure Laterality Date  . No past surgeries      History reviewed. No pertinent family history.  History  Substance Use Topics  . Smoking status: Never Smoker   . Smokeless tobacco: Never Used  . Alcohol Use: No    Allergies: No Known Allergies  Prescriptions prior to admission  Medication Sig Dispense Refill  . famotidine (PEPCID) 20 MG tablet Take 20 mg by mouth 2 (two) times daily.      . ondansetron (ZOFRAN) 4 MG tablet Take 4 mg by mouth every 6 (six) hours as needed for nausea or vomiting.      . promethazine (PHENERGAN) 25 MG tablet Take 0.5-1 tablets (12.5-25 mg total) by mouth every 6 (six) hours as needed.  30 tablet  0  . Prenatal Vit-Min-FA-Fish Oil ([facility] PRENATAL GUMMY) 0.4-113.5 MG CHEW Chew 2 each by mouth daily.         ROS Physical Exam   Blood pressure 123/67, pulse 87, temperature 98.2 F (36.8 C), temperature source Oral, resp. rate 20, height  (1.6 m), weight 54.035 kg (119 lb 2 oz), last menstrual period 06/07/2014, unknown if currently breastfeeding.  Physical Exam  Nursing note and vitals reviewed. Constitutional: She is oriented to person, place, and time. She appears well-developed and well-nourished. No distress.  Cardiovascular: Normal rate.   Respiratory: Effort normal.  GI: Soft. There is no tenderness. There is no rebound.  Neurological: She is alert and  oriented to person, place, and time.  Skin: Skin is warm and dry.  Psychiatric: She has a normal mood and affect.    MAU Course  Procedures  Results for orders placed during the hospital encounter of 08/16/14 (from the past 24 hour(s))  URINALYSIS, ROUTINE W REFLEX MICROSCOPIC     Status: Abnormal   Collection Time    08/16/14  3:37 AM      Result Value Ref Range   Color, Urine YELLOW  YELLOW   APPearance HAZY (*) CLEAR   Specific Gravity, Urine >1.030 (*) 1.005 - 1.030   pH 6.0  5.0 - 8.0   Glucose, UA NEGATIVE  NEGATIVE mg/dL   Hgb urine dipstick SMALL (*) NEGATIVE   Bilirubin Urine SMALL (*) NEGATIVE   Ketones, ur >80 (*) NEGATIVE mg/dL   Protein, ur 30 (*) NEGATIVE mg/dL   Urobilinogen, UA 0.2  0.0 - 1.0 mg/dL   Nitrite NEGATIVE  NEGATIVE   Leukocytes, UA NEGATIVE  NEGATIVE  URINE MICROSCOPIC-ADD ON     Status: Abnormal   Collection Time    08/16/14  3:37 AM      Result Value Ref Range   Squamous Epithelial / LPF MANY (*) RARE   WBC, UA 0-2  <3 WBC/hpf   RBC / HPF 3-6  <3 RBC/hpf   Bacteria, UA RARE  RARE  Assessment and Plan   1. Gastroesophageal reflux disease without esophagitis      Medication List         [facility] PRENATAL GUMMY 0.4-113.5 MG Chew  Chew 2 each by mouth daily.     famotidine 20 MG tablet  Commonly known as:  PEPCID  Take 20 mg by mouth 2 (two) times daily.     ondansetron 4 MG tablet  Commonly known as:  ZOFRAN  Take 4 mg by mouth every 6 (six) hours as needed for nausea or vomiting.     pantoprazole 20 MG tablet  Commonly known as:  PROTONIX  Take 1 tablet (20 mg total) by mouth 2 (two) times daily.     promethazine 25 MG tablet  Commonly known as:  PHENERGAN  Take 0.5-1 tablets (12.5-25 mg total) by mouth every 6 (six) hours as needed.       Follow-up Information   Follow up with [facility]. (As scheduled)    Specialty:  Obstetrics and Gynecology   Contact information:   [address] [place] [address] [phone]      Tawnya Crook 08/16/2014, 4:40 AM

## 2014-08-19 ENCOUNTER — Ambulatory Visit (INDEPENDENT_AMBULATORY_CARE_PROVIDER_SITE_OTHER): Payer: 59 | Admitting: Obstetrics and Gynecology

## 2014-08-19 ENCOUNTER — Other Ambulatory Visit: Payer: Self-pay | Admitting: Obstetrics and Gynecology

## 2014-08-19 ENCOUNTER — Encounter: Payer: Self-pay | Admitting: Obstetrics and Gynecology

## 2014-08-19 VITALS — BP 112/93 | HR 89 | Wt 115.0 lb

## 2014-08-19 DIAGNOSIS — Z3682 Encounter for antenatal screening for nuchal translucency: Secondary | ICD-10-CM

## 2014-08-19 DIAGNOSIS — Z3480 Encounter for supervision of other normal pregnancy, unspecified trimester: Secondary | ICD-10-CM

## 2014-08-19 DIAGNOSIS — Z3491 Encounter for supervision of normal pregnancy, unspecified, first trimester: Secondary | ICD-10-CM

## 2014-08-19 MED ORDER — CYCLOBENZAPRINE HCL 10 MG PO TABS: 10.0000 mg | ORAL_TABLET | Freq: Three times a day (TID) | ORAL | Status: DC | PRN

## 2014-08-19 MED ORDER — DOXYLAMINE-PYRIDOXINE 10-10 MG PO TBEC: DELAYED_RELEASE_TABLET | ORAL | Status: DC

## 2014-08-19 NOTE — Progress Notes (Signed)
Patient is still very sick,  She is vomiting several times a day every day.  No matter what she eats or drinks it comes right back up.  She has lost 15 pounds.  She is also having pain in her chest and stomach and pain in her neck and shoulder more on the right side.  She does throw up even when she hasn't tried to eat or drink.

## 2014-08-19 NOTE — Progress Notes (Signed)
Patient has persistent nausea and emesis but feels better overall. Rx Diglegis provided. Patient able to eat yogurt and fruit cups. Advised to stay well hydrated. First trimester screen ordered. Flexeril provided

## 2014-09-02 ENCOUNTER — Encounter (HOSPITAL_COMMUNITY): Payer: Self-pay | Admitting: *Deleted

## 2014-09-02 ENCOUNTER — Encounter (HOSPITAL_COMMUNITY): Payer: Self-pay

## 2014-09-02 ENCOUNTER — Encounter: Payer: Self-pay | Admitting: Obstetrics and Gynecology

## 2014-09-02 ENCOUNTER — Inpatient Hospital Stay (EMERGENCY_DEPARTMENT_HOSPITAL)
Admission: AD | Admit: 2014-09-02 | Discharge: 2014-09-02 | Disposition: A | Discharge status: Home / Self Care | Payer: 59 | Source: Ambulatory Visit | Attending: Obstetrics and Gynecology | Admitting: Obstetrics and Gynecology

## 2014-09-02 ENCOUNTER — Ambulatory Visit (HOSPITAL_COMMUNITY): Admission: RE | Admit: 2014-09-02 | Payer: 59 | Source: Ambulatory Visit

## 2014-09-02 ENCOUNTER — Ambulatory Visit (HOSPITAL_COMMUNITY)
Admission: RE | Admit: 2014-09-02 | Discharge: 2014-09-02 | Disposition: A | Discharge status: Home / Self Care | Payer: 59 | Source: Ambulatory Visit | Attending: Obstetrics and Gynecology | Admitting: Obstetrics and Gynecology

## 2014-09-02 ENCOUNTER — Other Ambulatory Visit: Payer: Self-pay

## 2014-09-02 DIAGNOSIS — O210 Mild hyperemesis gravidarum: Secondary | ICD-10-CM | POA: Insufficient documentation

## 2014-09-02 DIAGNOSIS — Z3682 Encounter for antenatal screening for nuchal translucency: Secondary | ICD-10-CM

## 2014-09-02 DIAGNOSIS — Z3491 Encounter for supervision of normal pregnancy, unspecified, first trimester: Secondary | ICD-10-CM

## 2014-09-02 DIAGNOSIS — R112 Nausea with vomiting, unspecified: Secondary | ICD-10-CM | POA: Diagnosis not present

## 2014-09-02 DIAGNOSIS — R0602 Shortness of breath: Secondary | ICD-10-CM | POA: Diagnosis present

## 2014-09-02 DIAGNOSIS — R7989 Other specified abnormal findings of blood chemistry: Secondary | ICD-10-CM | POA: Diagnosis present

## 2014-09-02 DIAGNOSIS — O99019 Anemia complicating pregnancy, unspecified trimester: Secondary | ICD-10-CM | POA: Diagnosis present

## 2014-09-02 DIAGNOSIS — R634 Abnormal weight loss: Secondary | ICD-10-CM | POA: Diagnosis present

## 2014-09-02 DIAGNOSIS — K8041 Calculus of bile duct with cholecystitis, unspecified, with obstruction: Secondary | ICD-10-CM | POA: Diagnosis present

## 2014-09-02 DIAGNOSIS — O99891 Other specified diseases and conditions complicating pregnancy: Secondary | ICD-10-CM | POA: Diagnosis not present

## 2014-09-02 DIAGNOSIS — E860 Dehydration: Secondary | ICD-10-CM | POA: Diagnosis present

## 2014-09-02 DIAGNOSIS — Z36 Encounter for antenatal screening of mother: Secondary | ICD-10-CM | POA: Insufficient documentation

## 2014-09-02 DIAGNOSIS — O9989 Other specified diseases and conditions complicating pregnancy, childbirth and the puerperium: Principal | ICD-10-CM

## 2014-09-02 DIAGNOSIS — R824 Acetonuria: Secondary | ICD-10-CM | POA: Diagnosis present

## 2014-09-02 DIAGNOSIS — D649 Anemia, unspecified: Secondary | ICD-10-CM | POA: Diagnosis present

## 2014-09-02 DIAGNOSIS — E876 Hypokalemia: Secondary | ICD-10-CM | POA: Diagnosis present

## 2014-09-02 DIAGNOSIS — N390 Urinary tract infection, site not specified: Secondary | ICD-10-CM | POA: Diagnosis present

## 2014-09-02 DIAGNOSIS — K859 Acute pancreatitis without necrosis or infection, unspecified: Secondary | ICD-10-CM | POA: Diagnosis present

## 2014-09-02 DIAGNOSIS — O219 Vomiting of pregnancy, unspecified: Secondary | ICD-10-CM

## 2014-09-02 DIAGNOSIS — R0789 Other chest pain: Secondary | ICD-10-CM | POA: Diagnosis present

## 2014-09-02 MED ORDER — DEXTROSE IN LACTATED RINGERS 5 % IV SOLN
25.0000 mg | Freq: Once | INTRAVENOUS | Status: AC
  Administered 2014-09-02: 25 mg via INTRAVENOUS
  Filled 2014-09-02: qty 1

## 2014-09-02 MED ORDER — DEXTROSE 5 % IN LACTATED RINGERS IV BOLUS
1000.0000 mL | Freq: Once | INTRAVENOUS | Status: AC
  Administered 2014-09-02: 1000 mL via INTRAVENOUS

## 2014-09-02 MED ORDER — ONDANSETRON 8 MG/NS 50 ML IVPB
8.0000 mg | Freq: Once | INTRAVENOUS | Status: AC
  Administered 2014-09-02: 8 mg via INTRAVENOUS
  Filled 2014-09-02: qty 8

## 2014-09-02 MED ORDER — METOCLOPRAMIDE HCL 10 MG PO TABS: 10.0000 mg | ORAL_TABLET | Freq: Three times a day (TID) | ORAL | Status: DC

## 2014-09-02 MED ORDER — ONDANSETRON 4 MG PO TBDP: 4.0000 mg | ORAL_TABLET | Freq: Three times a day (TID) | ORAL | Status: DC | PRN

## 2014-09-02 NOTE — ED Notes (Signed)
Pt to MAU for further evaluation.  Report given to Isabel Caprice RN.

## 2014-09-02 NOTE — Discharge Instructions (Signed)
Eating Plan for Hyperemesis Gravidarum  Severe cases of hyperemesis gravidarum can lead to dehydration and malnutrition. The hyperemesis eating plan is one way to lessen the symptoms of nausea and vomiting. It is often used with prescribed medicines to control your symptoms.   WHAT CAN I DO TO RELIEVE MY SYMPTOMS?  Listen to your body. Everyone is different and has different preferences. Find what works best for you. Some of the following things may help:  · Eat and drink slowly.  · Eat 5-6 small meals daily instead of 3 large meals.    · Eat crackers before you get out of bed in the morning.    · Starchy foods are usually well tolerated (such as cereal, toast, bread, potatoes, pasta, rice, and pretzels).    · Ginger may help with nausea. Add ¼ tsp ground ginger to hot tea or choose ginger tea.    · Try drinking 100% fruit juice or an electrolyte drink.  · Continue to take your prenatal vitamins as directed by your health care provider. If you are having trouble taking your prenatal vitamins, talk with your health care provider about different options.  · Include at least 1 serving of protein with your meals and snacks (such as meats or poultry, beans, nuts, eggs, or yogurt). Try eating a protein-rich snack before bed (such as cheese and crackers or a half turkey or peanut butter sandwich).  WHAT THINGS SHOULD I AVOID TO REDUCE MY SYMPTOMS?  The following things may help reduce your symptoms:  · Avoid foods with strong smells. Try eating meals in well-ventilated areas that are free of odors.  · Avoid drinking water or other beverages with meals. Try not to drink anything less than 30 minutes before and after meals.  · Avoid drinking more than 1 cup of fluid at a time.  · Avoid fried or high-fat foods, such as butter and cream sauces.  · Avoid spicy foods.  · Avoid skipping meals the best you can. Nausea can be more intense on an empty stomach. If you cannot tolerate food at that time, do not force it. Try sucking on  ice chips or other frozen items and make up the calories later.  · Avoid lying down within 2 hours after eating.  Document Released: 09/29/2007 Document Revised: 12/07/2013 Document Reviewed: 10/06/2013  Exit[employer]® Patient Information ©2015 [employer]. This information is not intended to replace advice given to you by your health care provider. Make sure you discuss any questions you have with your health care provider.

## 2014-09-02 NOTE — MAU Note (Signed)
Pt sent from [facility] has not been able to keep anything down. Has had a almost 20 lb weight loss since August.  Started feeling dizzy and lightheaded during her appointment.

## 2014-09-02 NOTE — ED Notes (Signed)
Pt having nausea and vomiting, taking diclegis which is not helping.  Pt has had a 7# wt loss in 14 days.  Having lower back pain.

## 2014-09-02 NOTE — MAU Provider Note (Signed)
  History     CSN: [id number]  Arrival date and time: 09/02/14 1611   First Provider Initiated Contact with Patient 09/02/14 1709      Chief Complaint  Patient presents with  . Emesis During Pregnancy   HPI  Toni Romero is a 23 y.o. G2P0010 at [redacted]w[redacted]d who presents today with nausea and vomiting. She has been taking diclegis, and has a rx for phenergan at home. She has not taken the phenergan for about 4-5 days. She states that she vomits every time she eats, and she is also spitting a lot. Her next appointment in the clinic is October 2.   Past Medical History  Diagnosis Date  . Medical history non-contributory     Past Surgical History  Procedure Laterality Date  . No past surgeries      History reviewed. No pertinent family history.  History  Substance Use Topics  . Smoking status: Never Smoker   . Smokeless tobacco: Never Used  . Alcohol Use: No    Allergies: No Known Allergies  Prescriptions prior to admission  Medication Sig Dispense Refill  . cyclobenzaprine (FLEXERIL) 10 MG tablet Take 1 tablet (10 mg total) by mouth every 8 (eight) hours as needed for muscle spasms.  30 tablet  1  . Doxylamine-Pyridoxine (DICLEGIS) 10-10 MG TBEC Take 2 tabs po at bedtime, if symptoms persists after 2 days, take 1 tablet in the morning and 2 tablet at night  60 tablet  3  . famotidine (PEPCID) 20 MG tablet Take 20 mg by mouth 2 (two) times daily.      . pantoprazole (PROTONIX) 20 MG tablet Take 1 tablet (20 mg total) by mouth 2 (two) times daily.  60 tablet  1  . promethazine (PHENERGAN) 25 MG tablet Take 12.5-25 mg by mouth every 6 (six) hours as needed for nausea or vomiting.        ROS Physical Exam   Blood pressure 103/68, pulse 116, temperature 98 F (36.7 C), resp. rate 18, height  (1.6 m), last menstrual period 06/07/2014, unknown if currently breastfeeding.  Physical Exam  Nursing note and vitals reviewed. Constitutional: She is oriented to person, place,  and time. She appears well-nourished. No distress.  Cardiovascular: Normal rate.   Respiratory: Effort normal.  GI: Soft. There is no tenderness.  Neurological: She is alert and oriented to person, place, and time.  Skin: Skin is warm and dry.  Psychiatric: She has a normal mood and affect.    MAU Course  Procedures  2000: Patient has not had any vomiting since being here. She is resting comfortably. She has had 2L IV fluids,  phenergan and  zofran. Will DC with additional meds.   Assessment and Plan   1. Supervision of low-risk pregnancy, first trimester   2. Nausea/vomiting in pregnancy    Comfort measures reviewed Return to MAU as needed Take phenergan at bedtime each night Reglan TID Zofran PRN   Follow-up Information   Follow up with [facility].   Specialty:  Obstetrics and Gynecology   Contact information:   [address] [place] [address] [phone]       Tawnya Crook 09/02/2014, 5:11 PM

## 2014-09-04 ENCOUNTER — Inpatient Hospital Stay (HOSPITAL_COMMUNITY)
Admission: EM | Admit: 2014-09-04 | Discharge: 2014-09-10 | DRG: 781 | Disposition: A | Discharge status: Home / Self Care | Payer: 59 | Attending: Internal Medicine | Admitting: Internal Medicine

## 2014-09-04 ENCOUNTER — Encounter (HOSPITAL_COMMUNITY): Payer: Self-pay | Admitting: Emergency Medicine

## 2014-09-04 ENCOUNTER — Emergency Department (HOSPITAL_COMMUNITY): Payer: 59

## 2014-09-04 DIAGNOSIS — E860 Dehydration: Secondary | ICD-10-CM

## 2014-09-04 DIAGNOSIS — K8051 Calculus of bile duct without cholangitis or cholecystitis with obstruction: Secondary | ICD-10-CM

## 2014-09-04 DIAGNOSIS — N390 Urinary tract infection, site not specified: Secondary | ICD-10-CM | POA: Diagnosis present

## 2014-09-04 DIAGNOSIS — O219 Vomiting of pregnancy, unspecified: Secondary | ICD-10-CM

## 2014-09-04 DIAGNOSIS — R824 Acetonuria: Secondary | ICD-10-CM

## 2014-09-04 DIAGNOSIS — K859 Acute pancreatitis without necrosis or infection, unspecified: Secondary | ICD-10-CM | POA: Diagnosis present

## 2014-09-04 DIAGNOSIS — D649 Anemia, unspecified: Secondary | ICD-10-CM

## 2014-09-04 DIAGNOSIS — R7989 Other specified abnormal findings of blood chemistry: Secondary | ICD-10-CM

## 2014-09-04 DIAGNOSIS — E876 Hypokalemia: Secondary | ICD-10-CM

## 2014-09-04 DIAGNOSIS — K851 Biliary acute pancreatitis without necrosis or infection: Secondary | ICD-10-CM

## 2014-09-04 DIAGNOSIS — Z3490 Encounter for supervision of normal pregnancy, unspecified, unspecified trimester: Secondary | ICD-10-CM

## 2014-09-04 DIAGNOSIS — R945 Abnormal results of liver function studies: Secondary | ICD-10-CM

## 2014-09-04 DIAGNOSIS — K828 Other specified diseases of gallbladder: Secondary | ICD-10-CM

## 2014-09-04 DIAGNOSIS — Z3492 Encounter for supervision of normal pregnancy, unspecified, second trimester: Secondary | ICD-10-CM

## 2014-09-04 DIAGNOSIS — R634 Abnormal weight loss: Secondary | ICD-10-CM

## 2014-09-04 LAB — COMPREHENSIVE METABOLIC PANEL
ALT: 428 U/L — AB (ref 0–35)
ANION GAP: 24 — AB (ref 5–15)
AST: 187 U/L — ABNORMAL HIGH (ref 0–37)
Albumin: 4.2 g/dL (ref 3.5–5.2)
Alkaline Phosphatase: 86 U/L (ref 39–117)
BUN: 16 mg/dL (ref 6–23)
CO2: 29 meq/L (ref 19–32)
CREATININE: 0.79 mg/dL (ref 0.50–1.10)
Calcium: 10.9 mg/dL — ABNORMAL HIGH (ref 8.4–10.5)
Chloride: 81 mEq/L — ABNORMAL LOW (ref 96–112)
GLUCOSE: 88 mg/dL (ref 70–99)
Potassium: 3 mEq/L — ABNORMAL LOW (ref 3.7–5.3)
Sodium: 134 mEq/L — ABNORMAL LOW (ref 137–147)
TOTAL PROTEIN: 9 g/dL — AB (ref 6.0–8.3)
Total Bilirubin: 5.4 mg/dL — ABNORMAL HIGH (ref 0.3–1.2)

## 2014-09-04 LAB — CBC WITH DIFFERENTIAL/PLATELET
Basophils Absolute: 0 10*3/uL (ref 0.0–0.1)
Basophils Relative: 0 % (ref 0–1)
EOS ABS: 0.1 10*3/uL (ref 0.0–0.7)
Eosinophils Relative: 1 % (ref 0–5)
HCT: 37.1 % (ref 36.0–46.0)
HEMOGLOBIN: 13.8 g/dL (ref 12.0–15.0)
Lymphocytes Relative: 21 % (ref 12–46)
Lymphs Abs: 1.4 10*3/uL (ref 0.7–4.0)
MCH: 32.5 pg (ref 26.0–34.0)
MCHC: 37.2 g/dL — ABNORMAL HIGH (ref 30.0–36.0)
MCV: 87.3 fL (ref 78.0–100.0)
MONOS PCT: 9 % (ref 3–12)
Monocytes Absolute: 0.6 10*3/uL (ref 0.1–1.0)
NEUTROS ABS: 4.7 10*3/uL (ref 1.7–7.7)
NEUTROS PCT: 69 % (ref 43–77)
PLATELETS: 201 10*3/uL (ref 150–400)
RBC: 4.25 MIL/uL (ref 3.87–5.11)
RDW: 12.1 % (ref 11.5–15.5)
WBC: 6.7 10*3/uL (ref 4.0–10.5)

## 2014-09-04 LAB — URINALYSIS, ROUTINE W REFLEX MICROSCOPIC
GLUCOSE, UA: NEGATIVE mg/dL
Ketones, ur: >80 mg/dL — AB
Nitrite: NEGATIVE
PROTEIN: 30 mg/dL — AB
SPECIFIC GRAVITY, URINE: 1.022 (ref 1.005–1.030)
Urobilinogen, UA: 2 mg/dL — ABNORMAL HIGH (ref 0.0–1.0)
pH: 5.5 (ref 5.0–8.0)

## 2014-09-04 LAB — URINE MICROSCOPIC-ADD ON

## 2014-09-04 LAB — HCG, QUANTITATIVE, PREGNANCY: HCG, BETA CHAIN, QUANT, S: 267201 m[IU]/mL — AB (ref <5)

## 2014-09-04 LAB — LIPASE, BLOOD: Lipase: 872 U/L — ABNORMAL HIGH (ref 11–59)

## 2014-09-04 MED ORDER — SODIUM CHLORIDE 0.9 % IV SOLN
INTRAVENOUS | Status: DC
  Administered 2014-09-04: via INTRAVENOUS

## 2014-09-04 MED ORDER — SODIUM CHLORIDE 0.9 % IV BOLUS (SEPSIS)
1000.0000 mL | Freq: Once | INTRAVENOUS | Status: AC
  Administered 2014-09-04: 1000 mL via INTRAVENOUS

## 2014-09-04 MED ORDER — POTASSIUM CHLORIDE 10 MEQ/100ML IV SOLN
10.0000 meq | INTRAVENOUS | Status: DC
  Administered 2014-09-04 – 2014-09-05 (×2): 10 meq via INTRAVENOUS
  Filled 2014-09-04 (×3): qty 100

## 2014-09-04 MED ORDER — ONDANSETRON HCL 4 MG/2ML IJ SOLN
4.0000 mg | Freq: Once | INTRAMUSCULAR | Status: AC
  Administered 2014-09-04: 4 mg via INTRAVENOUS
  Filled 2014-09-04: qty 2

## 2014-09-04 NOTE — MAU Provider Note (Signed)
Attestation of Attending Supervision of Advanced Practitioner (CNM/NP): Evaluation and management procedures were performed by the Advanced Practitioner under my supervision and collaboration.  I have reviewed the Advanced Practitioner's note and chart, and I agree with the management and plan.  Malik Ruffino 09/04/2014 9:54 AM   

## 2014-09-04 NOTE — ED Notes (Signed)
PT states she cant breath, cant eat, chest is tight, heart is racing, and reports 30 pounds weight loss in a month.  Pt is [redacted] weeks pregnant, no vaginal bleeding, but reports discharge.  PT states cannot keep anything.  Pt is on multiple meds and no relief.  Pt states zofran does not work.

## 2014-09-04 NOTE — ED Notes (Signed)
Pt states she is pregnant and unable to keep down water, nausea pills and food for over a month now.

## 2014-09-04 NOTE — ED Provider Notes (Signed)
TIME SEEN: 9:25 PM  CHIEF COMPLAINT: Chest pain, shortness of breath, vomiting, abdominal pain  HPI: Patient is a 23 y.o. F G2 P0 A1 with last menstrual period June 23 who is 12 weeks and 5 days pregnant by LMP who presents emergency department with persistent nausea, vomiting, chest tightness and shortness of breath that have been present her entire pregnancy. She reports she has had a 30 pound weight loss in the past several months. She is unable to tolerate liquids. No diarrhea. No fevers or chills. She states she's been seen at [facility] hospital several times for the same thing and has been diagnosed with hyperemesis gravidarum. She is on diclegis, Zofran, Phenergan and Reglan without relief of symptoms. Denies any prior abdominal surgery.  No vaginal bleeding or discharge.  No sick contacts or recent travel.  ROS: See HPI Constitutional: no fever  Eyes: no drainage  ENT: no runny nose   Cardiovascular:  chest pain  Resp: no SOB  GI:  vomiting GU: no dysuria Integumentary: no rash  Allergy: no hives  Musculoskeletal: no leg swelling  Neurological: no slurred speech ROS otherwise negative  PAST MEDICAL HISTORY/PAST SURGICAL HISTORY:  Past Medical History  Diagnosis Date  . Medical history non-contributory     MEDICATIONS:  Prior to Admission medications   Medication Sig Start Date End Date Taking? Authorizing Provider  cyclobenzaprine (FLEXERIL) 10 MG tablet Take 1 tablet (10 mg total) by mouth every 8 (eight) hours as needed for muscle spasms. 08/19/14   Peggy Constant, MD  Doxylamine-Pyridoxine (DICLEGIS) 10-10 MG TBEC Take 2 tabs po at bedtime, if symptoms persists after 2 days, take 1 tablet in the morning and 2 tablet at night 08/19/14   Peggy Constant, MD  famotidine (PEPCID) 20 MG tablet Take 20 mg by mouth 2 (two) times daily.    Historical Provider, MD  metoCLOPramide (REGLAN) 10 MG tablet Take 1 tablet (10 mg total) by mouth 3 (three) times daily before meals. 09/02/14    Heather Alger Memos, CNM  ondansetron (ZOFRAN ODT) 4 MG disintegrating tablet Take 1 tablet (4 mg total) by mouth every 8 (eight) hours as needed for nausea or vomiting. 09/02/14   Heather Alger Memos, CNM  pantoprazole (PROTONIX) 20 MG tablet Take 1 tablet (20 mg total) by mouth 2 (two) times daily. 08/16/14   Heather Alger Memos, CNM  promethazine (PHENERGAN) 25 MG tablet Take 12.5-25 mg by mouth every 6 (six) hours as needed for nausea or vomiting.    Historical Provider, MD    ALLERGIES:  No Known Allergies  SOCIAL HISTORY:  History  Substance Use Topics  . Smoking status: Never Smoker   . Smokeless tobacco: Never Used  . Alcohol Use: No    FAMILY HISTORY: No family history on file.  EXAM: BP 118/79  Pulse 127  Temp(Src) 98.2 F (36.8 C) (Oral)  Resp 16  Ht  (1.626 m)  Wt 108 lb (48.988 kg)  BMI 18.53 kg/m2  SpO2 98%  LMP 06/07/2014 CONSTITUTIONAL: Alert and oriented and responds appropriately to questions. Well-appearing; well-nourished HEAD: Normocephalic EYES: Conjunctivae clear, PERRL ENT: normal nose; no rhinorrhea; dry mucous membranes; pharynx without lesions noted NECK: Supple, no meningismus, no LAD  CARD: Regular and tachycardic; S1 and S2 appreciated; no murmurs, no clicks, no rubs, no gallops RESP: Normal chest excursion without splinting or tachypnea; breath sounds clear and equal bilaterally; no wheezes, no rhonchi, no rales,  ABD/GI: Normal bowel sounds; non-distended; soft, mildly tender to palpation in the  right upper quadrant and epigastric region without guarding or rebound, negative Murphy sign, no tenderness at McBurney's point BACK:  The back appears normal and is non-tender to palpation, there is no CVA tenderness EXT: Normal ROM in all joints; non-tender to palpation; no edema; normal capillary refill; no cyanosis    SKIN: Normal color for age and race; warm NEURO: Moves all extremities equally PSYCH: The patient's mood and manner are  appropriate. Grooming and personal hygiene are appropriate.  MEDICAL DECISION MAKING: Patient here with vomiting, tachycardia, chest pain shortness of breath. Suspect that her tachycardia secondary to dehydration. She does report feeling very lightheaded with standing. We'll give IV fluids, Zofran and reassess. We'll obtain abdominal labs including LFTs, lipase, urinalysis. Her abdominal exam at this time is benign. She denies having chest tightness or shortness of breath currently. Doubt that she is having pulmonary embolus at this time given she is asymptomatic. We'll continue to closely monitor. EKG shows no ischemic changes.   ED PROGRESS: Patient has elevated AST, ALT, total bilirubin and lipase. Given she is mildly tender to palpation in her right upper quadrant, will obtain an ultrasound to eval for obstructing stone, less likely acute cholecystitis - possibly chronic. She has no leukocytosis, no fever. She is hypokalemic at 3.0, will replace. She does have an anion gap of 24. We'll continue IV hydration. She continues to vomit in the emergency department. Urine shows trace hemoglobin, small leukocytes, few bacteria but also many squamous cells. Suspect this may be a dirty catch. We'll obtain a catheterized urine specimen. She has large ketones in her urine.     Signed out to Dr. Rhunette Croft to follow up on [place].  I feel pt will need admission for sx control.       Toni Maw Lynzi Meulemans, DO 09/05/14 1442

## 2014-09-05 ENCOUNTER — Inpatient Hospital Stay (HOSPITAL_COMMUNITY): Payer: 59

## 2014-09-05 ENCOUNTER — Encounter (HOSPITAL_COMMUNITY): Payer: Self-pay | Admitting: Internal Medicine

## 2014-09-05 DIAGNOSIS — K859 Acute pancreatitis without necrosis or infection, unspecified: Secondary | ICD-10-CM | POA: Diagnosis present

## 2014-09-05 DIAGNOSIS — R945 Abnormal results of liver function studies: Secondary | ICD-10-CM

## 2014-09-05 DIAGNOSIS — E876 Hypokalemia: Secondary | ICD-10-CM | POA: Diagnosis present

## 2014-09-05 DIAGNOSIS — K828 Other specified diseases of gallbladder: Secondary | ICD-10-CM | POA: Diagnosis present

## 2014-09-05 DIAGNOSIS — O219 Vomiting of pregnancy, unspecified: Secondary | ICD-10-CM | POA: Diagnosis present

## 2014-09-05 DIAGNOSIS — R824 Acetonuria: Secondary | ICD-10-CM | POA: Diagnosis present

## 2014-09-05 DIAGNOSIS — R634 Abnormal weight loss: Secondary | ICD-10-CM | POA: Diagnosis present

## 2014-09-05 DIAGNOSIS — D649 Anemia, unspecified: Secondary | ICD-10-CM | POA: Diagnosis present

## 2014-09-05 DIAGNOSIS — R0789 Other chest pain: Secondary | ICD-10-CM | POA: Diagnosis present

## 2014-09-05 DIAGNOSIS — E860 Dehydration: Secondary | ICD-10-CM | POA: Diagnosis present

## 2014-09-05 DIAGNOSIS — R7989 Other specified abnormal findings of blood chemistry: Secondary | ICD-10-CM

## 2014-09-05 DIAGNOSIS — K8041 Calculus of bile duct with cholecystitis, unspecified, with obstruction: Secondary | ICD-10-CM | POA: Diagnosis present

## 2014-09-05 DIAGNOSIS — O99891 Other specified diseases and conditions complicating pregnancy: Secondary | ICD-10-CM | POA: Diagnosis present

## 2014-09-05 DIAGNOSIS — O210 Mild hyperemesis gravidarum: Secondary | ICD-10-CM | POA: Diagnosis present

## 2014-09-05 DIAGNOSIS — N390 Urinary tract infection, site not specified: Secondary | ICD-10-CM | POA: Diagnosis present

## 2014-09-05 DIAGNOSIS — O99019 Anemia complicating pregnancy, unspecified trimester: Secondary | ICD-10-CM | POA: Diagnosis present

## 2014-09-05 DIAGNOSIS — R0602 Shortness of breath: Secondary | ICD-10-CM | POA: Diagnosis present

## 2014-09-05 DIAGNOSIS — K8051 Calculus of bile duct without cholangitis or cholecystitis with obstruction: Secondary | ICD-10-CM

## 2014-09-05 DIAGNOSIS — R112 Nausea with vomiting, unspecified: Secondary | ICD-10-CM | POA: Diagnosis present

## 2014-09-05 HISTORY — DX: Other specified diseases of gallbladder: K82.8

## 2014-09-05 LAB — COMPREHENSIVE METABOLIC PANEL
ALT: 278 U/L — ABNORMAL HIGH (ref 0–35)
AST: 121 U/L — ABNORMAL HIGH (ref 0–37)
Albumin: 2.8 g/dL — ABNORMAL LOW (ref 3.5–5.2)
Alkaline Phosphatase: 57 U/L (ref 39–117)
Anion gap: 16 — ABNORMAL HIGH (ref 5–15)
BUN: 11 mg/dL (ref 6–23)
CO2: 22 mEq/L (ref 19–32)
Calcium: 8.4 mg/dL (ref 8.4–10.5)
Chloride: 96 mEq/L (ref 96–112)
Creatinine, Ser: 0.69 mg/dL (ref 0.50–1.10)
GFR calc Af Amer: >90 mL/min (ref >90)
GFR calc non Af Amer: >90 mL/min (ref >90)
Glucose, Bld: 84 mg/dL (ref 70–99)
Potassium: 3.5 mEq/L — ABNORMAL LOW (ref 3.7–5.3)
Sodium: 134 mEq/L — ABNORMAL LOW (ref 137–147)
Total Bilirubin: 3.2 mg/dL — ABNORMAL HIGH (ref 0.3–1.2)
Total Protein: 6.1 g/dL (ref 6.0–8.3)

## 2014-09-05 LAB — URINALYSIS, ROUTINE W REFLEX MICROSCOPIC
Glucose, UA: NEGATIVE mg/dL
Hgb urine dipstick: NEGATIVE
Nitrite: NEGATIVE
PROTEIN: 30 mg/dL — AB
Specific Gravity, Urine: 1.019 (ref 1.005–1.030)
UROBILINOGEN UA: 2 mg/dL — AB (ref 0.0–1.0)
pH: 6 (ref 5.0–8.0)

## 2014-09-05 LAB — CBC
HCT: 26.1 % — ABNORMAL LOW (ref 36.0–46.0)
Hemoglobin: 9.4 g/dL — ABNORMAL LOW (ref 12.0–15.0)
MCH: 31.8 pg (ref 26.0–34.0)
MCHC: 36 g/dL (ref 30.0–36.0)
MCV: 88.2 fL (ref 78.0–100.0)
Platelets: 171 10*3/uL (ref 150–400)
RBC: 2.96 MIL/uL — ABNORMAL LOW (ref 3.87–5.11)
RDW: 12.4 % (ref 11.5–15.5)
WBC: 6.5 10*3/uL (ref 4.0–10.5)

## 2014-09-05 LAB — URINE MICROSCOPIC-ADD ON

## 2014-09-05 LAB — LIPASE, BLOOD: Lipase: 856 U/L — ABNORMAL HIGH (ref 11–59)

## 2014-09-05 MED ORDER — SODIUM CHLORIDE 0.9 % IV SOLN
INTRAVENOUS | Status: DC
  Administered 2014-09-05 (×3): via INTRAVENOUS

## 2014-09-05 MED ORDER — PROMETHAZINE HCL 25 MG/ML IJ SOLN: 12.5000 mg | Freq: Four times a day (QID) | INTRAMUSCULAR | Status: DC | PRN

## 2014-09-05 MED ORDER — ACETAMINOPHEN 325 MG PO TABS: 650.0000 mg | ORAL_TABLET | Freq: Four times a day (QID) | ORAL | Status: DC | PRN

## 2014-09-05 MED ORDER — ACETAMINOPHEN 650 MG RE SUPP: 650.0000 mg | Freq: Four times a day (QID) | RECTAL | Status: DC | PRN

## 2014-09-05 MED ORDER — PANTOPRAZOLE SODIUM 20 MG PO TBEC
20.0000 mg | DELAYED_RELEASE_TABLET | Freq: Two times a day (BID) | ORAL | Status: DC
  Filled 2014-09-05 (×2): qty 1

## 2014-09-05 MED ORDER — PROMETHAZINE HCL 25 MG PO TABS: 12.5000 mg | ORAL_TABLET | Freq: Four times a day (QID) | ORAL | Status: DC | PRN

## 2014-09-05 MED ORDER — DEXTROSE 5 % IV SOLN
1.0000 g | Freq: Every day | INTRAVENOUS | Status: DC
  Administered 2014-09-05 – 2014-09-09 (×6): 1 g via INTRAVENOUS
  Filled 2014-09-05 (×6): qty 10

## 2014-09-05 MED ORDER — DEXTROSE-NACL 5-0.45 % IV SOLN: INTRAVENOUS | Status: AC

## 2014-09-05 MED ORDER — PANTOPRAZOLE SODIUM 40 MG IV SOLR
20.0000 mg | Freq: Two times a day (BID) | INTRAVENOUS | Status: DC
  Administered 2014-09-05 – 2014-09-09 (×10): 20 mg via INTRAVENOUS
  Filled 2014-09-05 (×4): qty 20
  Filled 2014-09-05 (×2): qty 40
  Filled 2014-09-05 (×3): qty 20
  Filled 2014-09-05: qty 40
  Filled 2014-09-05 (×3): qty 20
  Filled 2014-09-05: qty 40

## 2014-09-05 MED ORDER — ONDANSETRON HCL 4 MG/2ML IJ SOLN
4.0000 mg | Freq: Four times a day (QID) | INTRAMUSCULAR | Status: DC | PRN
  Administered 2014-09-05 – 2014-09-08 (×5): 4 mg via INTRAVENOUS
  Filled 2014-09-05 (×4): qty 2

## 2014-09-05 MED ORDER — POTASSIUM CHLORIDE 10 MEQ/100ML IV SOLN
10.0000 meq | INTRAVENOUS | Status: AC
  Administered 2014-09-05 (×2): 10 meq via INTRAVENOUS

## 2014-09-05 MED ORDER — PROMETHAZINE HCL 25 MG RE SUPP: 12.5000 mg | Freq: Four times a day (QID) | RECTAL | Status: DC | PRN

## 2014-09-05 MED ORDER — PROMETHAZINE HCL 25 MG/ML IJ SOLN
25.0000 mg | Freq: Once | INTRAMUSCULAR | Status: AC
  Administered 2014-09-05: 25 mg via INTRAVENOUS
  Filled 2014-09-05: qty 1

## 2014-09-05 MED ORDER — DEXTROSE 5 % IN LACTATED RINGERS IV BOLUS
500.0000 mL | Freq: Once | INTRAVENOUS | Status: AC
  Administered 2014-09-05: 500 mL via INTRAVENOUS

## 2014-09-05 MED ORDER — ONDANSETRON HCL 4 MG PO TABS: 4.0000 mg | ORAL_TABLET | Freq: Four times a day (QID) | ORAL | Status: DC | PRN

## 2014-09-05 NOTE — ED Notes (Signed)
Repeat urine sample sent to lab.

## 2014-09-05 NOTE — ED Notes (Signed)
Dr. Nanavati at bedside 

## 2014-09-05 NOTE — ED Provider Notes (Signed)
  Physical Exam  BP 116/72  Pulse 103  Temp(Src) 98.2 F (36.8 C) (Oral)  Resp 16  Ht  (1.626 m)  Wt 108 lb (48.988 kg)  BMI 18.53 kg/m2  SpO2 100%  LMP 06/07/2014  Physical Exam  ED Course  Procedures  MDM Assuming care of patient. Patient in the ED for N/V/ABD PAIN AND SHE IS PREGNANT. G2P0, [redacted] weeks pregnant. Pt has elevated liver enzymes, bili, and lipase elevation as well. Ketones in the urine. US shows gall bladder sludge. No acute cholecystitis. Exam shows no peritoneal signs. Spoke with Dr. Jolayne Panther, OB. Recommends medicine admission, hydration and OB clearance for now - unless they are needed as consultants. Nothing to monitor from OB side for now. Medicine, Dr. Betti Cruz to admit.  Dr. Lindie Spruce, Surgery consulted. GI not consulted from ER, and medicine aware.    ICD-9-CM ICD-10-CM  1. Calculus of bile duct with obstruction and without cholangitis or cholecystitis 574.51 K80.51  2. Acute biliary pancreatitis 577.0 K85.1  3. Ketonuria 791.6 R82.4  4. Dehydration 276.51 E86.0     Derwood Kaplan, MD 09/05/14 [phone]

## 2014-09-05 NOTE — Consult Note (Signed)
Reason for Consult:Gallstone pancreatitis/ pregnant Referring Physician: Dr. Vella Raring is an 23 y.o. female.  HPI: This is a 23 yo female who is [redacted] weeks pregnant who presents with 3 months of hyperemesis gravidarum.  This is her second pregnancy.  She suffered a miscarriage with the first pregnancy.  She has had significant difficulty with PO intake, with nausea/ vomiting from even taking clear liquids.  She has lost significant weight and reports no bowel movements in a few weeks.  She presented to [facility] ED with nausea, vomiting, right-sided abdominal/ chest pain, and mild shortness of breath.  She was diagnosed with gallstone pancreatitis with elevated bilirubin.  She has been admitted by [facility].  It does not appear that GI has been consulted yet.  Past Medical History  Diagnosis Date  . Medical history non-contributory   [id number]  Past Surgical History  Procedure Laterality Date  . No past surgeries      Family History  Problem Relation Age of Onset  . Healthy Mother   . Healthy Father     Social History:  reports that she has never smoked. She quit smokeless tobacco use about 3 months ago. She reports that she does not drink alcohol or use illicit drugs.  Allergies: No Known Allergies  Medications:  Prior to Admission medications   Medication Sig Start Date End Date Taking? Authorizing Provider  cyclobenzaprine (FLEXERIL) 10 MG tablet Take 1 tablet (10 mg total) by mouth every 8 (eight) hours as needed for muscle spasms. 08/19/14  Yes Peggy Constant, MD  Doxylamine-Pyridoxine (DICLEGIS) 10-10 MG TBEC Take 2 tabs po at bedtime, if symptoms persists after 2 days, take 1 tablet in the morning and 2 tablet at night 08/19/14  Yes Peggy Constant, MD  ondansetron (ZOFRAN ODT) 4 MG disintegrating tablet Take 1 tablet (4 mg total) by mouth every 8 (eight) hours as needed for nausea or vomiting. 09/02/14  Yes Heather Erby Pian, CNM  pantoprazole (PROTONIX) 20 MG tablet  Take 1 tablet (20 mg total) by mouth 2 (two) times daily. 08/16/14  Yes Heather Erby Pian, CNM  promethazine (PHENERGAN) 25 MG tablet Take 12.5-25 mg by mouth every 6 (six) hours as needed for nausea or vomiting.   Yes Historical Provider, MD  metoCLOPramide (REGLAN) 10 MG tablet Take 1 tablet (10 mg total) by mouth 3 (three) times daily before meals. 09/02/14   Mathis Bud, CNM     Results for orders placed during the hospital encounter of 09/04/14 (from the past 48 hour(s))  COMPREHENSIVE METABOLIC PANEL     Status: Abnormal   Collection Time    09/04/14  9:29 PM      Result Value Ref Range   Sodium 134 (*) 137 - 147 mEq/L   Potassium 3.0 (*) 3.7 - 5.3 mEq/L   Chloride 81 (*) 96 - 112 mEq/L   CO2 29  19 - 32 mEq/L   Glucose, Bld 88  70 - 99 mg/dL   BUN 16  6 - 23 mg/dL   Creatinine, Ser 0.79  0.50 - 1.10 mg/dL   Calcium 10.9 (*) 8.4 - 10.5 mg/dL   Total Protein 9.0 (*) 6.0 - 8.3 g/dL   Albumin 4.2  3.5 - 5.2 g/dL   AST 187 (*) 0 - 37 U/L   ALT 428 (*) 0 - 35 U/L   Alkaline Phosphatase 86  39 - 117 U/L   Total Bilirubin 5.4 (*) 0.3 - 1.2 mg/dL   GFR calc  non Af Amer >90  >90 mL/min   GFR calc Af Amer >90  >90 mL/min   Comment: (NOTE)     The eGFR has been calculated using the CKD EPI equation.     This calculation has not been validated in all clinical situations.     eGFR's persistently <90 mL/min signify possible Chronic Kidney     Disease.   Anion gap 24 (*) 5 - 15  CBC WITH DIFFERENTIAL     Status: Abnormal   Collection Time    09/04/14  9:29 PM      Result Value Ref Range   WBC 6.7  4.0 - 10.5 K/uL   RBC 4.25  3.87 - 5.11 MIL/uL   Hemoglobin 13.8  12.0 - 15.0 g/dL   HCT 37.1  36.0 - 46.0 %   MCV 87.3  78.0 - 100.0 fL   MCH 32.5  26.0 - 34.0 pg   MCHC 37.2 (*) 30.0 - 36.0 g/dL   Comment: RULED OUT INTERFERING SUBSTANCES   RDW 12.1  11.5 - 15.5 %   Platelets 201  150 - 400 K/uL   Neutrophils Relative % 69  43 - 77 %   Neutro Abs 4.7  1.7 - 7.7 K/uL    Lymphocytes Relative 21  12 - 46 %   Lymphs Abs 1.4  0.7 - 4.0 K/uL   Monocytes Relative 9  3 - 12 %   Monocytes Absolute 0.6  0.1 - 1.0 K/uL   Eosinophils Relative 1  0 - 5 %   Eosinophils Absolute 0.1  0.0 - 0.7 K/uL   Basophils Relative 0  0 - 1 %   Basophils Absolute 0.0  0.0 - 0.1 K/uL  HCG, QUANTITATIVE, PREGNANCY     Status: Abnormal   Collection Time    09/04/14  9:30 PM      Result Value Ref Range   hCG, Beta ChainLaqueta Carina 267201 (*) <5 mIU/mL   Comment:              GEST. AGE      CONC.  (mIU/mL)       <=1 WEEK        5 - 50         2 WEEKS       50 - 500         3 WEEKS       100 - 10,000         4 WEEKS     1,000 - 30,000         5 WEEKS     3,500 - 115,000       6-8 WEEKS     12,000 - 270,000        12 WEEKS     15,000 - 220,000                FEMALE AND NON-PREGNANT FEMALE:         LESS THAN 5 mIU/mL  LIPASE, BLOOD     Status: Abnormal   Collection Time    09/04/14  9:31 PM      Result Value Ref Range   Lipase 872 (*) 11 - 59 U/L  URINALYSIS, ROUTINE W REFLEX MICROSCOPIC     Status: Abnormal   Collection Time    09/04/14 10:48 PM      Result Value Ref Range   Color, Urine ORANGE (*) YELLOW   Comment: BIOCHEMICALS MAY BE AFFECTED BY COLOR  APPearance CLOUDY (*) CLEAR   Specific Gravity, Urine 1.022  1.005 - 1.030   pH 5.5  5.0 - 8.0   Glucose, UA NEGATIVE  NEGATIVE mg/dL   Hgb urine dipstick TRACE (*) NEGATIVE   Bilirubin Urine LARGE (*) NEGATIVE   Ketones, ur >80 (*) NEGATIVE mg/dL   Protein, ur 30 (*) NEGATIVE mg/dL   Urobilinogen, UA 2.0 (*) 0.0 - 1.0 mg/dL   Nitrite NEGATIVE  NEGATIVE   Leukocytes, UA SMALL (*) NEGATIVE  URINE MICROSCOPIC-ADD ON     Status: Abnormal   Collection Time    09/04/14 10:48 PM      Result Value Ref Range   Squamous Epithelial / LPF MANY (*) RARE   WBC, UA 3-6  <3 WBC/hpf   RBC / HPF 3-6  <3 RBC/hpf   Bacteria, UA FEW (*) RARE   Casts HYALINE CASTS (*) NEGATIVE   Urine-Other MUCOUS PRESENT    URINALYSIS, ROUTINE W  REFLEX MICROSCOPIC     Status: Abnormal   Collection Time    09/05/14  2:22 AM      Result Value Ref Range   Color, Urine ORANGE (*) YELLOW   Comment: BIOCHEMICALS MAY BE AFFECTED BY COLOR   APPearance CLOUDY (*) CLEAR   Specific Gravity, Urine 1.019  1.005 - 1.030   pH 6.0  5.0 - 8.0   Glucose, UA NEGATIVE  NEGATIVE mg/dL   Hgb urine dipstick NEGATIVE  NEGATIVE   Bilirubin Urine LARGE (*) NEGATIVE   Ketones, ur >80 (*) NEGATIVE mg/dL   Protein, ur 30 (*) NEGATIVE mg/dL   Urobilinogen, UA 2.0 (*) 0.0 - 1.0 mg/dL   Nitrite NEGATIVE  NEGATIVE   Leukocytes, UA TRACE (*) NEGATIVE  URINE MICROSCOPIC-ADD ON     Status: Abnormal   Collection Time    09/05/14  2:22 AM      Result Value Ref Range   Squamous Epithelial / LPF MANY (*) RARE   WBC, UA 7-10  <3 WBC/hpf   RBC / HPF 3-6  <3 RBC/hpf   Bacteria, UA FEW (*) RARE   Casts HYALINE CASTS (*) NEGATIVE   Urine-Other MUCOUS PRESENT      US Abdomen Complete  09/05/2014   CLINICAL DATA:  Elevated lipase and T bili, emesis during pregnancy.  EXAM: ULTRASOUND ABDOMEN COMPLETE  COMPARISON:  None.  FINDINGS: Gallbladder:  Multiple punctate echogenic foci central within the gallbladder. No gallbladder wall thickening or pericholecystic fluid. No sonographic Murphy's sign elicited.  Common bile duct:  Diameter: 2 mm  Liver:  No focal lesion identified. Within normal limits in parenchymal echogenicity.  IVC:  No abnormality visualized.  Pancreas:  Visualized portion unremarkable.  Spleen:  Size and appearance within normal limits.  Right Kidney:  Length: 10.4 cm. Echogenicity within normal limits. No mass or hydronephrosis visualized.  Left Kidney:  Length: 10.1 cm. Echogenicity within normal limits. No mass or hydronephrosis visualized.  Abdominal aorta:  No aneurysm visualized.  Other findings:  None.  IMPRESSION: Gallbladder sludge without sonographic findings of acute cholecystitis.   Electronically Signed   By: Elon Alas   On: 09/05/2014  00:42    Review of Systems  Constitutional: Negative for weight loss.  HENT: Negative for ear discharge, ear pain, hearing loss and tinnitus.   Eyes: Negative for blurred vision, double vision, photophobia and pain.  Respiratory: Positive for shortness of breath. Negative for cough and sputum production.   Cardiovascular: Negative for chest pain.  Gastrointestinal: Positive for nausea, vomiting,  abdominal pain and constipation.  Genitourinary: Negative for dysuria, urgency, frequency and flank pain.  Musculoskeletal: Negative for back pain, falls, joint pain, myalgias and neck pain.  Neurological: Negative for dizziness, tingling, sensory change, focal weakness, loss of consciousness and headaches.  Endo/Heme/Allergies: Does not bruise/bleed easily.  Psychiatric/Behavioral: Negative for depression, memory loss and substance abuse. The patient is not nervous/anxious.    Blood pressure 115/61, pulse 104, temperature 98.2 F (36.8 C), temperature source Oral, resp. rate 16, height 5' 4"  (1.626 m), weight 113 lb 3.2 oz (51.347 kg), last menstrual period 06/07/2014, SpO2 100.00%, unknown if currently breastfeeding. Physical Exam WDWN in NAD HEENT:  EOMI, sclera mildly icteric Neck:  No masses, no thyromegaly Lungs:  CTA bilaterally; normal respiratory effort CV:  Regular rate and rhythm; no murmurs Abd:  +bowel sounds, soft, non-tender, no masses Ext:  Well-perfused; no edema Skin:  Warm, dry;   Assessment/Plan: Choledocholithiasis with obstruction and pancreatitis  No sign of acute cholecystitis - no GB wall thickening and normal WBC  Recommendations:  GI consult to evaluate for ERCP/ sphincterotomy to relieve CBD obstruction She will need laparoscopic cholecystectomy sometime during her second trimester, but there would be no benefit to performing it right now with CBD obstruction. Continue rehydration/ antibiotics Follow LFT's, lipase - recheck in AM  Theophil Thivierge K. 09/05/2014,  9:43 AM

## 2014-09-05 NOTE — Progress Notes (Signed)
INITIAL NUTRITION ASSESSMENT  DOCUMENTATION CODES Per approved criteria  -Severe malnutrition in the context of acute illness or injury   INTERVENTION: Recommend nasojejunal feeding tube placement and initiation of short-term EN Recommend prenatal MVI daily PO vs IV? RD to follow for nutrition care plan  NUTRITION DIAGNOSIS: Inadequate oral intake related to inability to eat as evidenced by NPO status  Goal: Pt to meet >/= 90% of their estimated nutrition needs   Monitor:  PO diet advancement & intake, weight, labs, I/O's  Reason for Assessment: Malnutrition Screening Tool Report  23 y.o. female  Admitting Dx: Nausea and vomiting during pregnancy  ASSESSMENT: 23 y.o. Female with no significant past medical history except patient is G2 P0 A1 with last menstrual cycle on June 23 who is currently 12 weeks and 6 days pregnant who presented with N/V, right sided chest pain, shortness of breath, and right upper quadrant abdominal pain.  Abdominal ultrasound impression: gallblader sludge without sonographic findings of acute cholecystitis.  Patient reports a poor appetite and not being able to keep anything down for approximatley 1 week; she tried to consume yogurt and Ensure supplements but was not able to tolerate; endorses a 30 lb weight loss (19%) x 2 months -- severe for time frame; currently NPO; RD concerned for this patient; feel she needs nutrition support.  Nutrition focused physical exam completed.  No muscle or subcutaneous fat depletion noticed.  Patient meets criteria for severe malnutrition in the context of acute illness as evidenced by < 50% intake of estimated energy requirement for > 5 days and 19% weight loss x 2 months.  Height: Ht Readings from Last 1 Encounters:  09/05/14  (1.626 m)    Weight: Wt Readings from Last 1 Encounters:  09/05/14 113 lb 3.2 oz (51.347 kg)    Ideal Body Weight: 120 lb  % Ideal Body Weight: 94%  Wt Readings from Last 10  Encounters:  09/05/14 113 lb 3.2 oz (51.347 kg)  09/02/14 108 lb (48.988 kg)  08/19/14 115 lb (52.164 kg)  08/16/14 119 lb 2 oz (54.035 kg)  08/14/14 122 lb (55.339 kg)  07/22/14 131 lb (59.421 kg)  02/25/14 130 lb 14.4 oz (59.376 kg)  02/15/14 126 lb 14.4 oz (57.561 kg)  01/20/14 116 lb 9.6 oz (52.889 kg)  01/09/14 128 lb (58.06 kg)    Usual Body Weight: 140 lb -- per patient  % Usual Body Weight: 81%  BMI:  Body mass index is 19.42 kg/(m^2).  Estimated Nutritional Needs: Kcal: 1900-2100 Protein:  85-95 gm Fluid: 1.9-2.1 L  Skin: Intact  Diet Order: NPO  EDUCATION NEEDS: -No education needs identified at this time   Intake/Output Summary (Last 24 hours) at 09/05/14 1608 Last data filed at 09/05/14 1500  Gross per 24 hour  Intake   5180 ml  Output    250 ml  Net   4930 ml   Labs:   Recent Labs Lab 09/04/14 2129 09/05/14 0910  NA 134* 134*  K 3.0* 3.5*  CL 81* 96  CO2 29 22  BUN 16 11  CREATININE 0.79 0.69  CALCIUM 10.9* 8.4  GLUCOSE 88 84    Scheduled Meds: . cefTRIAXone (ROCEPHIN)  IV  1 g Intravenous QHS  . dextrose 5 % and 0.45% NaCl   Intravenous STAT  . pantoprazole (PROTONIX) IV  20 mg Intravenous BID    Continuous Infusions: . sodium chloride 125 mL/hr at 09/05/14 1254    Past Medical History  Diagnosis Date  .  Medical history non-contributory     Past Surgical History  Procedure Laterality Date  . No past surgeries      Maureen Chatters, RD, LDN Pager #: [phone] After-Hours Pager #: [phone]

## 2014-09-05 NOTE — Consult Note (Signed)
Referring Provider: Dr Corliss Skains Primary Care Physician:  No PCP Per Patient Primary Gastroenterologist:  unassigned  Reason for Consultation:  Choledocholithiasis with obstruction and pancreatitis.     HPI: Toni Romero is a 23 y.o. female  Admitted through the ER early this morning with a 6 week history af nausea and vomiting.Pt is G2P0A1, LMP June 23, currently [redacted] weeks pregnant. She says she has been nauseous for 6-8 weeks, and has been vomiting daily. She has been in close contact with her obstetrician and has tried zofran, phenergan,diclegis,  pepcid and protonix with little relief.  She has been unable to hold liquids or solids down, and has been sent to [facility] 3 times for IV fluids. She has lost 30 pounds since becoming pregnant, and has not had a BM in 2 weeks. Over the past few days she has developed RUQ pain that radiates to her right shoulder, worsening nausea, and SOB. She had an ultrasound that showed gallbladder sludge, no gallbladder thickening or pericholecystic fluid. The visualized portion of pancreas was unremarkable.Pt reports her urine has been dark for a week and her eyes started looking yellow 2 days ago.   Past Medical History  Diagnosis Date  . Medical history non-contributory     Past Surgical History  Procedure Laterality Date  . No past surgeries      Prior to Admission medications   Medication Sig Start Date End Date Taking? Authorizing Provider  cyclobenzaprine (FLEXERIL) 10 MG tablet Take 1 tablet (10 mg total) by mouth every 8 (eight) hours as needed for muscle spasms. 08/19/14  Yes Peggy Constant, MD  Doxylamine-Pyridoxine (DICLEGIS) 10-10 MG TBEC Take 2 tabs po at bedtime, if symptoms persists after 2 days, take 1 tablet in the morning and 2 tablet at night 08/19/14  Yes Peggy Constant, MD  ondansetron (ZOFRAN ODT) 4 MG disintegrating tablet Take 1 tablet (4 mg total) by mouth every 8 (eight) hours as needed for nausea or vomiting. 09/02/14  Yes  Heather Alger Memos, CNM  pantoprazole (PROTONIX) 20 MG tablet Take 1 tablet (20 mg total) by mouth 2 (two) times daily. 08/16/14  Yes Heather Alger Memos, CNM  promethazine (PHENERGAN) 25 MG tablet Take 12.5-25 mg by mouth every 6 (six) hours as needed for nausea or vomiting.   Yes Historical Provider, MD  metoCLOPramide (REGLAN) 10 MG tablet Take 1 tablet (10 mg total) by mouth 3 (three) times daily before meals. 09/02/14   Tawnya Crook, CNM    Current Facility-Administered Medications  Medication Dose Route Frequency Provider Last Rate Last Dose  . 0.9 %  sodium chloride infusion   Intravenous Continuous Cristal Ford, MD 125 mL/hr at 09/05/14 0404    . cefTRIAXone (ROCEPHIN) 1 g in dextrose 5 % 50 mL IVPB  1 g Intravenous QHS Cristal Ford, MD   1 g at 09/05/14 0405  . dextrose 5 %-0.45 % sodium chloride infusion   Intravenous STAT Ankit Nanavati, MD      . ondansetron (ZOFRAN) tablet 4 mg  4 mg Oral Q6H PRN Cristal Ford, MD       Or  . ondansetron (ZOFRAN) injection 4 mg  4 mg Intravenous Q6H PRN Cristal Ford, MD   4 mg at 09/05/14 0252  . pantoprazole (PROTONIX) injection 20 mg  20 mg Intravenous BID Penny Pia, MD   20 mg at 09/05/14 1045  . promethazine (PHENERGAN) tablet 12.5 mg  12.5 mg Oral Q6H PRN Cristal Ford, MD  Or  . promethazine (PHENERGAN) injection 12.5 mg  12.5 mg Intravenous Q6H PRN Cristal Ford, MD       Or  . promethazine (PHENERGAN) suppository 12.5 mg  12.5 mg Rectal Q6H PRN Cristal Ford, MD        Allergies as of 09/04/2014  . (No Known Allergies)    Family History  Problem Relation Age of Onset  . Healthy Mother   . Healthy Father     History   Social History  . Marital Status: Single    Spouse Name: N/A    Number of Children: N/A  . Years of Education: N/A   Occupational History  . Not on file.   Social History Main Topics  . Smoking status: Never Smoker   . Smokeless tobacco: Former Neurosurgeon    Quit date: 06/05/2014    . Alcohol Use: No  . Drug Use: No  . Sexual Activity: Yes    Birth Control/ Protection: None   Other Topics Concern  . Not on file   Social History Narrative  . No narrative on file    Review of Systems: Gen: Denies any fever, chills, sweats, anorexia, fatigue, weakness, malaise, weight loss, and sleep disorder CV: Denies chest pain, angina, palpitations, syncope, orthopnea, PND, peripheral edema, and claudication. Resp: Denies dyspnea with exercise, cough, sputum, wheezing, coughing up blood, and pleurisy.Felt SOB  With worsening nausea on admission, says she does not feel SOB now. GI:  Denies dysphagia or odynophagia.Has been nauseous and vomiting for 6-8 weeks. Has had dark urine for a week. Noted yellowing of her eyes 2 days ago. GU : Denies urinary burning, blood in urine, urinary frequency, urinary hesitancy, nocturnal urination, and urinary incontinence. MS: Denies joint pain, limitation of movement, and swelling, stiffness, low back pain, extremity pain. Denies muscle weakness, cramps, atrophy.  Derm: Denies rash, itching, dry skin, hives, moles, warts, or unhealing ulcers.  Psych: Denies depression, anxiety, memory loss, suicidal ideation, hallucinations, paranoia, and confusion. Heme: Denies bruising, bleeding, and enlarged lymph nodes. Neuro:  Denies any headaches, dizziness, paresthesias. Endo:  Denies any problems with DM, thyroid, adrenal function.  Physical Exam: Vital signs in last 24 hours: Temp:  [97.9 F (36.6 C)-98.2 F (36.8 C)] 98.2 F (36.8 C) (09/21 0543) Pulse Rate:  [98-127] 104 (09/21 0543) Resp:  [13-19] 16 (09/21 0543) BP: (113-129)/(61-90) 115/61 mmHg (09/21 0543) SpO2:  [98 %-100 %] 100 % (09/21 0543) Weight:  [108 lb (48.988 kg)-113 lb 3.2 oz (51.347 kg)] 113 lb 3.2 oz (51.347 kg) (09/21 0235) Last BM Date:  (patient states it has been a month since her last BM) General:   Alert,  Well-developed, well-nourished, pleasant and cooperative in  NAD Head:  Normocephalic and atraumatic. Eyes:  Sclera mildly icteric   Conjunctiva pink. Ears:  Normal auditory acuity. Nose:  No deformity, discharge,  or lesions. Mouth:  No deformity or lesions.   Neck:  Supple; no masses or thyromegaly. Lungs:  Clear throughout to auscultation.   No wheezes, crackles, or rhonchi  Heart:  Regular rate and rhythm; no murmurs, clicks, rubs,  or gallops. Abdomen:  Soft,mild right upper quadrant tenderness, BS active,nonpalp mass or hsm.   Rectal:  Deferred  Msk:  Symmetrical without gross deformities. . Pulses:  Normal pulses noted. Extremities:  Without clubbing or edema. Neurologic:  Alert and  oriented x4;  grossly normal neurologically. Skin:  Intact without significant lesions or rashes.. Psych:  Alert and cooperative. Normal mood and affect.  Intake/Output from previous day: 09/20 0701 - 09/21 0700 In: 3750 [I.V.:2350; IV Piggyback:1400] Out: -  Intake/Output this shift: Total I/O In: 0  Out: 250 [Urine:250]  Lab Results:  Recent Labs  09/04/14 2129 09/05/14 0910  WBC 6.7 6.5  HGB 13.8 9.4*  HCT 37.1 26.1*  PLT 201 171   BMET  Recent Labs  09/04/14 2129 09/05/14 0910  NA 134* 134*  K 3.0* 3.5*  CL 81* 96  CO2 29 22  GLUCOSE 88 84  BUN 16 11  CREATININE 0.79 0.69  CALCIUM 10.9* 8.4   LFT  Recent Labs  09/05/14 0910  PROT 6.1  ALBUMIN 2.8*  AST 121*  ALT 278*  ALKPHOS 57  BILITOT 3.2*   LIPASE 856 on 09/05/14 HCG quantitative on 09/04/14 hCG, Beta Chain, Quant, S <5 mIU/mL  267201 (H)   Comments:  GEST. AGE CONC. (mIU/mL) <=1 WEEK 5 - 50 2 WEEKS 50 - 500 3 WEEKS 100 - 10,000 4 WEEKS 1,000 - 30,000 5 WEEKS 3,500 - 115,000 6-8 WEEKS 12,000 - 270,000 12 WEEKS 15,000 - 220,000   Studies/Results: US Abdomen Complete  09/05/2014   CLINICAL DATA:  Elevated lipase and T bili, emesis during pregnancy.  EXAM: ULTRASOUND ABDOMEN COMPLETE  COMPARISON:  None.  FINDINGS: Gallbladder:  Multiple punctate echogenic  foci central within the gallbladder. No gallbladder wall thickening or pericholecystic fluid. No sonographic Murphy's sign elicited.  Common bile duct:  Diameter: 2 mm  Liver:  No focal lesion identified. Within normal limits in parenchymal echogenicity.  IVC:  No abnormality visualized.  Pancreas:  Visualized portion unremarkable.  Spleen:  Size and appearance within normal limits.  Right Kidney:  Length: 10.4 cm. Echogenicity within normal limits. No mass or hydronephrosis visualized.  Left Kidney:  Length: 10.1 cm. Echogenicity within normal limits. No mass or hydronephrosis visualized.  Abdominal aorta:  No aneurysm visualized.  Other findings:  None.  IMPRESSION: Gallbladder sludge without sonographic findings of acute cholecystitis.   Electronically Signed   By: Awilda Metro   On: 09/05/2014 00:42    IMPRESSION/PLAN: 1.Nausea and vomiting. This is likely due to her choledocholitiasis with obstruction and pancreatitis. She has a normal WBC and no thickening of GB wall. There CBD is nondilated on [place]. Will order with MRCP for further eval. 2. Pregnancy. Pt to be  Monitored by OB. 3.UTI. Currently on ceftriaxone. Urine culture pending.   Hvozdovic, Moise Boring 09/05/2014,  Pager [phone]  ________________________________________________________________________  [facility] MD note:  I personally examined the patient, reviewed the data and agree with the assessment and plan described above.  Gallstone pancreatitis.  LFTs improving and CBD was not dilated on [place].  Would like to be as certain as possible that she has persistent CBD stone/fragment before committing to ERCP given elevated risk for precedure related pancreatitis (more common in young women) and the fact that she is pregnant.  Have ordered MRCP, repeat LFTs in AM.   Rob Bunting, MD [facility] Pager [phone]

## 2014-09-05 NOTE — H&P (Signed)
Patient's PCP: No PCP Per Patient Patient's OB: Dr. Jolayne Panther  Chief Complaint: Nausea and vomiting  History of Present Illness: Toni Romero is a 23 y.o. African American female with no significant past medical history except patient is G2 P0 A1 with last menstrual cycle on June 23 who is currently 12 weeks and 6 days pregnant who presents with nausea, vomiting, right sided chest pain, shortness of breath, and right upper quadrant abdominal pain.  Patient reports that since being pregnant, over the last 2 months patient has had increasing nausea and vomiting.  She indicates that she is vomiting about 8 times daily over the last 2 months.  She is on an extensive regimen for nausea and vomiting but has not received any relief.  She has been to [facility] multiple times for same condition and was diagnosed with hyperemesis gravidarum.  Patient indicates that she has lost about 30 pounds since her onset of symptoms.  She presented to the emergency department for further evaluation.  In the emergency department, she was found to have elevated liver function tests and lipase.  Abdominal ultrasound showed gallbladder sludge without sonographic findings of acute cholecystitis.  ED physician, discussed case with Dr. Jolayne Panther, patient's OB physician, who thought patient likely had GI etiology for her symptoms and recommended patient be admitted to [facility].  Hospitalist service was asked to the patient for further care and management.  Patient denies any fevers but has been having chills.  Patient complains of chest pain/tightness and shortness of breath due to extensive nausea and vomiting.  Denies any diarrhea.  Denies any headaches but does complain of being dizzy and lightheaded when she stands up.  Review of Systems: All systems reviewed with the patient and positive as per history of present illness, otherwise all other systems are negative.  Past Medical History  Diagnosis Date  .  Medical history non-contributory    Past Surgical History  Procedure Laterality Date  . No past surgeries     Family History  Problem Relation Age of Onset  . Healthy Mother   . Healthy Father    History   Social History  . Marital Status: Single    Spouse Name: N/A    Number of Children: N/A  . Years of Education: N/A   Occupational History  . Not on file.   Social History Main Topics  . Smoking status: Never Smoker   . Smokeless tobacco: Former Neurosurgeon    Quit date: 06/05/2014  . Alcohol Use: No  . Drug Use: No  . Sexual Activity: Yes    Birth Control/ Protection: None   Other Topics Concern  . Not on file   Social History Narrative  . No narrative on file   Allergies: Review of patient's allergies indicates no known allergies.  Home Meds: Prior to Admission medications   Medication Sig Start Date End Date Taking? Authorizing Provider  cyclobenzaprine (FLEXERIL) 10 MG tablet Take 1 tablet (10 mg total) by mouth every 8 (eight) hours as needed for muscle spasms. 08/19/14  Yes Peggy Constant, MD  Doxylamine-Pyridoxine (DICLEGIS) 10-10 MG TBEC Take 2 tabs po at bedtime, if symptoms persists after 2 days, take 1 tablet in the morning and 2 tablet at night 08/19/14  Yes Peggy Constant, MD  ondansetron (ZOFRAN ODT) 4 MG disintegrating tablet Take 1 tablet (4 mg total) by mouth every 8 (eight) hours as needed for nausea or vomiting. 09/02/14  Yes Heather Alger Memos, CNM  pantoprazole (PROTONIX) 20  MG tablet Take 1 tablet (20 mg total) by mouth 2 (two) times daily. 08/16/14  Yes Heather Alger Memos, CNM  promethazine (PHENERGAN) 25 MG tablet Take 12.5-25 mg by mouth every 6 (six) hours as needed for nausea or vomiting.   Yes Historical Provider, MD  metoCLOPramide (REGLAN) 10 MG tablet Take 1 tablet (10 mg total) by mouth 3 (three) times daily before meals. 09/02/14   Heather Alger Memos, CNM    Physical Exam: Blood pressure 113/90, pulse 102, temperature 97.9 F (36.6 C),  temperature source Oral, resp. rate 16, height  (1.626 m), weight 48.988 kg (108 lb), last menstrual period 06/07/2014, SpO2 100.00%, unknown if currently breastfeeding. General: Awake, Oriented x3, No acute distress. HEENT: EOMI, Moist mucous membranes Neck: Supple CV: S1 and S2 Lungs: Clear to ascultation bilaterally Abdomen: Soft, Nontender, Nondistended, +bowel sounds. Ext: Good pulses. Trace edema. No clubbing or cyanosis noted. Neuro: Cranial Nerves II-XII grossly intact. Has 5/5 motor strength in upper and lower extremities.  Lab results:  Recent Labs  09/04/14 2129  NA 134*  K 3.0*  CL 81*  CO2 29  GLUCOSE 88  BUN 16  CREATININE 0.79  CALCIUM 10.9*    Recent Labs  09/04/14 2129  AST 187*  ALT 428*  ALKPHOS 86  BILITOT 5.4*  PROT 9.0*  ALBUMIN 4.2    Recent Labs  09/04/14 2131  LIPASE 872*    Recent Labs  09/04/14 2129  WBC 6.7  NEUTROABS 4.7  HGB 13.8  HCT 37.1  MCV 87.3  PLT 201   No results found for this basename: CKTOTAL, CKMB, CKMBINDEX, TROPONINI,  in the last 72 hours No components found with this basename: POCBNP,  No results found for this basename: DDIMER,  in the last 72 hours No results found for this basename: HGBA1C,  in the last 72 hours No results found for this basename: CHOL, HDL, LDLCALC, TRIG, CHOLHDL, LDLDIRECT,  in the last 72 hours No results found for this basename: TSH, T4TOTAL, FREET3, T3FREE, THYROIDAB,  in the last 72 hours No results found for this basename: VITAMINB12, FOLATE, FERRITIN, TIBC, IRON, RETICCTPCT,  in the last 72 hours Imaging results:  US Abdomen Complete  09/05/2014   CLINICAL DATA:  Elevated lipase and T bili, emesis during pregnancy.  EXAM: ULTRASOUND ABDOMEN COMPLETE  COMPARISON:  None.  FINDINGS: Gallbladder:  Multiple punctate echogenic foci central within the gallbladder. No gallbladder wall thickening or pericholecystic fluid. No sonographic Murphy's sign elicited.  Common bile duct:   Diameter: 2 mm  Liver:  No focal lesion identified. Within normal limits in parenchymal echogenicity.  IVC:  No abnormality visualized.  Pancreas:  Visualized portion unremarkable.  Spleen:  Size and appearance within normal limits.  Right Kidney:  Length: 10.4 cm. Echogenicity within normal limits. No mass or hydronephrosis visualized.  Left Kidney:  Length: 10.1 cm. Echogenicity within normal limits. No mass or hydronephrosis visualized.  Abdominal aorta:  No aneurysm visualized.  Other findings:  None.  IMPRESSION: Gallbladder sludge without sonographic findings of acute cholecystitis.   Electronically Signed   By: Awilda Metro   On: 09/05/2014 00:42   [place] Mfm Fetal Nuchal Translucency  09/02/2014   OBSTETRICAL ULTRASOUND: This exam was performed within a [facility]. The OB US report was generated in the AS system, and faxed to the ordering physician.   This report is available in the [employer]. See the [facility] Obstetric US report via the Image Link.  Assessment &  Plan by Problem: Nausea and vomiting during pregnancy Unclear etiology.  Hyperemesis gravidarum versus GI etiology, more specifically gallbladder pathology causing her symptoms.  Management with anti-emetics and aggressive fluid resuscitation.  Elevated liver function tests and lipase Unclear etiology.  Uncertain if patient has pancreatitis from gallbladder etiology.  Requested consultation with general surgery, appreciate their input.  Will have the patient n.p.o. for now.  Supportive management with IV fluids.  General surgery has been consulted by ED physician, appreciate their input.  Depending on general surgery's evaluation, may consider GI evaluation.  Continue to trend lipase and liver function tests.  Urinary tract infection Start ceftriaxone.  Further management of antibiotics depending on culture results and sensitivities.  Pregnancy Consider to monitor.  Depending on patient's clinical course may  contact her OB physician Dr. Jolayne Panther for further evaluation.  Hypokalemia Replace as needed.  Check magnesium in the morning.  Likely due to nausea and vomiting.  Gallbladder sludge Management as indicated above.  Appreciate input of general surgery.  Prophylaxis SCDs.  CODE STATUS Full code.  Disposition Admit the patient to medical bed as inpatient.   Time spent on admission, talking to the patient, and coordinating care was: 50 mins.  Chrystian Ressler A, MD 09/05/2014, 2:30 AM

## 2014-09-05 NOTE — Progress Notes (Signed)
Patient seen and evaluated earlier this AM by my associate.   Will reassess next am.  Glorie Dowlen  

## 2014-09-06 LAB — COMPREHENSIVE METABOLIC PANEL
ALT: 201 U/L — ABNORMAL HIGH (ref 0–35)
ANION GAP: 14 (ref 5–15)
AST: 70 U/L — ABNORMAL HIGH (ref 0–37)
Albumin: 2.3 g/dL — ABNORMAL LOW (ref 3.5–5.2)
Alkaline Phosphatase: 47 U/L (ref 39–117)
BUN: 9 mg/dL (ref 6–23)
CALCIUM: 8.5 mg/dL (ref 8.4–10.5)
CO2: 20 mEq/L (ref 19–32)
Chloride: 103 mEq/L (ref 96–112)
Creatinine, Ser: 0.57 mg/dL (ref 0.50–1.10)
GFR calc Af Amer: >90 mL/min (ref >90)
GFR calc non Af Amer: >90 mL/min (ref >90)
GLUCOSE: 62 mg/dL — AB (ref 70–99)
Potassium: 3.2 mEq/L — ABNORMAL LOW (ref 3.7–5.3)
SODIUM: 137 meq/L (ref 137–147)
TOTAL PROTEIN: 5.1 g/dL — AB (ref 6.0–8.3)
Total Bilirubin: 1.4 mg/dL — ABNORMAL HIGH (ref 0.3–1.2)

## 2014-09-06 LAB — URINE CULTURE
Colony Count: 75000
Special Requests: NORMAL

## 2014-09-06 LAB — LIPASE, BLOOD: Lipase: 673 U/L — ABNORMAL HIGH (ref 11–59)

## 2014-09-06 LAB — MAGNESIUM: MAGNESIUM: 1.7 mg/dL (ref 1.5–2.5)

## 2014-09-06 MED ORDER — POTASSIUM CHLORIDE CRYS ER 20 MEQ PO TBCR
40.0000 meq | EXTENDED_RELEASE_TABLET | Freq: Once | ORAL | Status: AC
  Administered 2014-09-06: 40 meq via ORAL
  Filled 2014-09-06: qty 2

## 2014-09-06 MED ORDER — DEXTROSE-NACL 5-0.45 % IV SOLN
INTRAVENOUS | Status: DC
  Administered 2014-09-06 – 2014-09-09 (×9): via INTRAVENOUS

## 2014-09-06 MED ORDER — MAGNESIUM OXIDE 400 (241.3 MG) MG PO TABS
400.0000 mg | ORAL_TABLET | Freq: Two times a day (BID) | ORAL | Status: DC
  Administered 2014-09-06 – 2014-09-10 (×9): 400 mg via ORAL
  Filled 2014-09-06 (×11): qty 1

## 2014-09-06 MED ORDER — BOOST / RESOURCE BREEZE PO LIQD
1.0000 | Freq: Three times a day (TID) | ORAL | Status: DC
  Administered 2014-09-06: 1 via ORAL
  Administered 2014-09-06 – 2014-09-08 (×2): via ORAL

## 2014-09-06 MED ORDER — ENOXAPARIN SODIUM 40 MG/0.4ML ~~LOC~~ SOLN
40.0000 mg | SUBCUTANEOUS | Status: DC
  Administered 2014-09-06 – 2014-09-08 (×3): 40 mg via SUBCUTANEOUS
  Filled 2014-09-06 (×3): qty 0.4

## 2014-09-06 MED ORDER — POTASSIUM CHLORIDE CRYS ER 20 MEQ PO TBCR: 40.0000 meq | EXTENDED_RELEASE_TABLET | Freq: Once | ORAL | Status: DC

## 2014-09-06 NOTE — Progress Notes (Signed)
Significantly improved Still with elevated lipase Will attempt clears per GI - recheck lipase in AM Will tentatively consider lap chole with IOC later in the week - early in second  Trimester.  Wilmon Arms. Corliss Skains, MD, [facility] Surgery  General/ Trauma Surgery  09/06/2014 9:49 AM

## 2014-09-06 NOTE — Progress Notes (Signed)
[facility] Progress Note     Subjective: Pt feels better, but vomitied all last night.  No current N/V now.  Abdomen hurts just in epigastrium.  Ambulating well.  Mom and boyfriend at bedside.    Objective: Vital signs in last 24 hours: Temp:  [97.8 F (36.6 C)-98.3 F (36.8 C)] 98.3 F (36.8 C) (09/22 0536) Pulse Rate:  [92-94] 92 (09/22 0536) Resp:  [16-18] 16 (09/22 0536) BP: (106-115)/(56-69) 106/56 mmHg (09/22 0536) SpO2:  [100 %] 100 % (09/22 0536) Last BM Date:  (Per pt last BM was a month ago)  Intake/Output from previous day: 09/21 0701 - 09/22 0700 In: 4244 [P.O.:180; I.V.:4064] Out: 1300 [Urine:1300] Intake/Output this shift:    PE: Gen:  Alert, NAD, pleasant Abd: Soft, minimal tenderness in epigastrium, ND, +BS, no HSM,  no abdominal scars noted   Lab Results:   Recent Labs  09/04/14 2129 09/05/14 0910  WBC 6.7 6.5  HGB 13.8 9.4*  HCT 37.1 26.1*  PLT 201 171   BMET  Recent Labs  09/05/14 0910 09/06/14 0406  NA 134* 137  K 3.5* 3.2*  CL 96 103  CO2 22 20  GLUCOSE 84 62*  BUN 11 9  CREATININE 0.69 0.57  CALCIUM 8.4 8.5   PT/INR No results found for this basename: LABPROT, INR,  in the last 72 hours CMP     Component Value Date/Time   NA 137 09/06/2014 0406   K 3.2* 09/06/2014 0406   CL 103 09/06/2014 0406   CO2 20 09/06/2014 0406   GLUCOSE 62* 09/06/2014 0406   BUN 9 09/06/2014 0406   CREATININE 0.57 09/06/2014 0406   CALCIUM 8.5 09/06/2014 0406   PROT 5.1* 09/06/2014 0406   ALBUMIN 2.3* 09/06/2014 0406   AST 70* 09/06/2014 0406   ALT 201* 09/06/2014 0406   ALKPHOS 47 09/06/2014 0406   BILITOT 1.4* 09/06/2014 0406   GFRNONAA >90 09/06/2014 0406   GFRAA >90 09/06/2014 0406   Lipase     Component Value Date/Time   LIPASE 673* 09/06/2014 0406       Studies/Results: US Abdomen Complete  09/05/2014   CLINICAL DATA:  Elevated lipase and T bili, emesis during pregnancy.  EXAM: ULTRASOUND ABDOMEN COMPLETE  COMPARISON:  None.   FINDINGS: Gallbladder:  Multiple punctate echogenic foci central within the gallbladder. No gallbladder wall thickening or pericholecystic fluid. No sonographic Murphy's sign elicited.  Common bile duct:  Diameter: 2 mm  Liver:  No focal lesion identified. Within normal limits in parenchymal echogenicity.  IVC:  No abnormality visualized.  Pancreas:  Visualized portion unremarkable.  Spleen:  Size and appearance within normal limits.  Right Kidney:  Length: 10.4 cm. Echogenicity within normal limits. No mass or hydronephrosis visualized.  Left Kidney:  Length: 10.1 cm. Echogenicity within normal limits. No mass or hydronephrosis visualized.  Abdominal aorta:  No aneurysm visualized.  Other findings:  None.  IMPRESSION: Gallbladder sludge without sonographic findings of acute cholecystitis.   Electronically Signed   By: Awilda Metro   On: 09/05/2014 00:42   Mr Abdomen Mrcp Wo Cm  09/05/2014   CLINICAL DATA:  Pregnant patient with nausea, vomiting and right-sided chest pain. Shortness of breath. Right upper quadrant abdominal pain. Elevated liver function tests and elevated lipase.  EXAM: MRI ABDOMEN WITHOUT CONTRAST  (INCLUDING MRCP)  TECHNIQUE: Multiplanar multisequence MR imaging of the abdomen was performed. Heavily T2-weighted images of the biliary and pancreatic ducts were obtained, and three-dimensional MRCP images were rendered  by post processing.  COMPARISON:  None.  FINDINGS: No focal hepatic lesions are noted. There is diffuse increased T2 signal intensity adjacent to the portal triads, compatible with periportal edema. MRCP images demonstrate no intra or extrahepatic biliary ductal dilatation. The common bile duct is normal in caliber measuring 3 mm in the porta hepatis. Pancreatic duct is not dilated. The gallbladder does not appear distended. No filling defects within the gallbladder or within the common bile duct to suggest presence of cholelithiasis or choledocholithiasis.  There is a small  amount of increased T2 signal intensity in the retroperitoneum adjacent to the pancreas. No well-defined peripancreatic fluid collection is noted. The appearance of the spleen, bilateral adrenal glands and bilateral kidneys is unremarkable on today's non contrast MRI examination.  IMPRESSION: 1. No intra or extrahepatic biliary ductal dilatation. 2. Periportal edema in the liver. 3. Small amount of increased T2 signal intensity in the retroperitoneum adjacent to the pancreas, presumably secondary to inflammatory changes given the patient's elevated lipase.   Electronically Signed   By: Trudie Reed M.D.   On: 09/05/2014 19:01   Mr 3d Recon At Scanner  09/05/2014   CLINICAL DATA:  Pregnant patient with nausea, vomiting and right-sided chest pain. Shortness of breath. Right upper quadrant abdominal pain. Elevated liver function tests and elevated lipase.  EXAM: MRI ABDOMEN WITHOUT CONTRAST  (INCLUDING MRCP)  TECHNIQUE: Multiplanar multisequence MR imaging of the abdomen was performed. Heavily T2-weighted images of the biliary and pancreatic ducts were obtained, and three-dimensional MRCP images were rendered by post processing.  COMPARISON:  None.  FINDINGS: No focal hepatic lesions are noted. There is diffuse increased T2 signal intensity adjacent to the portal triads, compatible with periportal edema. MRCP images demonstrate no intra or extrahepatic biliary ductal dilatation. The common bile duct is normal in caliber measuring 3 mm in the porta hepatis. Pancreatic duct is not dilated. The gallbladder does not appear distended. No filling defects within the gallbladder or within the common bile duct to suggest presence of cholelithiasis or choledocholithiasis.  There is a small amount of increased T2 signal intensity in the retroperitoneum adjacent to the pancreas. No well-defined peripancreatic fluid collection is noted. The appearance of the spleen, bilateral adrenal glands and bilateral kidneys is  unremarkable on today's non contrast MRI examination.  IMPRESSION: 1. No intra or extrahepatic biliary ductal dilatation. 2. Periportal edema in the liver. 3. Small amount of increased T2 signal intensity in the retroperitoneum adjacent to the pancreas, presumably secondary to inflammatory changes given the patient's elevated lipase.   Electronically Signed   By: Trudie Reed M.D.   On: 09/05/2014 19:01    Anti-infectives: Anti-infectives   Start     Dose/Rate Route Frequency Ordered Stop   09/05/14 0330  cefTRIAXone (ROCEPHIN) 1 g in dextrose 5 % 50 mL IVPB     1 g 100 mL/hr over 30 Minutes Intravenous Daily at bedtime 09/05/14 0254         Assessment/Plan Cholelithiasis Acute pancreatitis  Pregnancy - beginning of 2nd trimester  Plan: 1.  No sign of acute cholecystitis - no GB wall thickening and normal WBC  2.  MRCP negative for CBD stone, GI not planning on ERCP, they started her on clears 3.  She will need laparoscopic cholecystectomy sometime during her second trimester.  Dr. Corliss Skains to discuss timing with the patient.  May be able to do later this week once lipase has normalized vs in a few weeks 4.  Continue rehydration / antibiotics  5.  LFT's improving, continue to monitor. 6.  Ambulate and IS 7.  SCD's and start lovenox     LOS: 2 days    Aris Georgia 09/06/2014, 8:51 AM Pager: [phone]

## 2014-09-06 NOTE — Progress Notes (Signed)
Patient ID: Toni Romero  female  ZOX:[id number]    DOB: 11-Sep-1991    DOA: 09/04/2014  PCP: No PCP Per Patient  Assessment/Plan: Principal Problem: Nausea and vomiting during pregnancy worsened likely due to acute pancreatitis, choledocholithiasis with obstruction - Continue clears, gastroenterology and surgery consulted  - MRCP negative for CBD stone or CBD dilatation - Started on clears by gastroenterology, awaiting surgery decision regarding timing of laparoscopic cholecystectomy, patient just started second trimester yesterday.  - Continue IV antiemetics and fluids  Elevated liver function tests and lipase  - Due to #1, trending down  Urinary tract infection  - Continue IV Rocephin, await urine culture and results  Second trimester Pregnancy  - Consider to monitor. Depending on patient's clinical course may contact her OB physician Dr. Jolayne Panther for further evaluation.   Hypokalemia and hypo-magnesimia - Likely due to nausea and vomiting, decreased  DVT Prophylaxis: Lovenox  Code Status: Full code  Family Communication: Discussed with patient's husband and mother at the bedside  Disposition:  Consultants:  Surgery  Gastroenterology  Procedures:  None  Antibiotics:  IV Rocephin    Subjective: Patient seen and examined, nausea and vomiting improving, hungry  Objective: Weight change:   Intake/Output Summary (Last 24 hours) at 09/06/14 1158 Last data filed at 09/06/14 0850  Gross per 24 hour  Intake   4124 ml  Output   1050 ml  Net   3074 ml   Blood pressure 106/56, pulse 92, temperature 98.3 F (36.8 C), temperature source Oral, resp. rate 16, height  (1.626 m), weight 51.347 kg (113 lb 3.2 oz), last menstrual period 06/07/2014, SpO2 100.00%, unknown if currently breastfeeding.  Physical Exam: General: Alert and awake, oriented x3, not in any acute distress. CVS: S1-S2 clear, no murmur rubs or gallops Chest: clear to auscultation  bilaterally, no wheezing, rales or rhonchi Abdomen: soft nontender, nondistended, normal bowel sounds  Extremities: no cyanosis, clubbing or edema noted bilaterally Neuro: Cranial nerves II-XII intact, no focal neurological deficits  Lab Results: Basic Metabolic Panel:  Recent Labs Lab 09/05/14 0910 09/06/14 0406  NA 134* 137  K 3.5* 3.2*  CL 96 103  CO2 22 20  GLUCOSE 84 62*  BUN 11 9  CREATININE 0.69 0.57  CALCIUM 8.4 8.5  MG  --  1.7   Liver Function Tests:  Recent Labs Lab 09/05/14 0910 09/06/14 0406  AST 121* 70*  ALT 278* 201*  ALKPHOS 57 47  BILITOT 3.2* 1.4*  PROT 6.1 5.1*  ALBUMIN 2.8* 2.3*    Recent Labs Lab 09/05/14 0910 09/06/14 0406  LIPASE 856* 673*   No results found for this basename: AMMONIA,  in the last 168 hours CBC:  Recent Labs Lab 09/04/14 2129 09/05/14 0910  WBC 6.7 6.5  NEUTROABS 4.7  --   HGB 13.8 9.4*  HCT 37.1 26.1*  MCV 87.3 88.2  PLT 201 171   Cardiac Enzymes: No results found for this basename: CKTOTAL, CKMB, CKMBINDEX, TROPONINI,  in the last 168 hours BNP: No components found with this basename: POCBNP,  CBG: No results found for this basename: GLUCAP,  in the last 168 hours   Micro Results: Recent Results (from the past 240 hour(s))  URINE CULTURE     Status: None   Collection Time    09/05/14  2:22 AM      Result Value Ref Range Status   Specimen [place] PaciniURINE, RANDOM   Final   Special Requests Normal   Final  Culture  Setup Time     Final   Value: 09/05/2014 10:18     Performed at [facility] Count     Final   Value: 75,000 COLONIES/ML     Performed at [facility]   Culture     Final   Value: Multiple bacterial morphotypes present, none predominant. Suggest appropriate recollection if clinically indicated.     Performed at [facility]   Report Status 09/06/2014 FINAL   Final    Studies/Results: US Abdomen Complete  09/05/2014   CLINICAL DATA:  Elevated  lipase and T bili, emesis during pregnancy.  EXAM: ULTRASOUND ABDOMEN COMPLETE  COMPARISON:  None.  FINDINGS: Gallbladder:  Multiple punctate echogenic foci central within the gallbladder. No gallbladder wall thickening or pericholecystic fluid. No sonographic Murphy's sign elicited.  Common bile duct:  Diameter: 2 mm  Liver:  No focal lesion identified. Within normal limits in parenchymal echogenicity.  IVC:  No abnormality visualized.  Pancreas:  Visualized portion unremarkable.  Spleen:  Size and appearance within normal limits.  Right Kidney:  Length: 10.4 cm. Echogenicity within normal limits. No mass or hydronephrosis visualized.  Left Kidney:  Length: 10.1 cm. Echogenicity within normal limits. No mass or hydronephrosis visualized.  Abdominal aorta:  No aneurysm visualized.  Other findings:  None.  IMPRESSION: Gallbladder sludge without sonographic findings of acute cholecystitis.   Electronically Signed   By: Awilda Metro   On: 09/05/2014 00:42   Mr Abdomen Mrcp Wo Cm  09/05/2014   CLINICAL DATA:  Pregnant patient with nausea, vomiting and right-sided chest pain. Shortness of breath. Right upper quadrant abdominal pain. Elevated liver function tests and elevated lipase.  EXAM: MRI ABDOMEN WITHOUT CONTRAST  (INCLUDING MRCP)  TECHNIQUE: Multiplanar multisequence MR imaging of the abdomen was performed. Heavily T2-weighted images of the biliary and pancreatic ducts were obtained, and three-dimensional MRCP images were rendered by post processing.  COMPARISON:  None.  FINDINGS: No focal hepatic lesions are noted. There is diffuse increased T2 signal intensity adjacent to the portal triads, compatible with periportal edema. MRCP images demonstrate no intra or extrahepatic biliary ductal dilatation. The common bile duct is normal in caliber measuring 3 mm in the porta hepatis. Pancreatic duct is not dilated. The gallbladder does not appear distended. No filling defects within the gallbladder or within the  common bile duct to suggest presence of cholelithiasis or choledocholithiasis.  There is a small amount of increased T2 signal intensity in the retroperitoneum adjacent to the pancreas. No well-defined peripancreatic fluid collection is noted. The appearance of the spleen, bilateral adrenal glands and bilateral kidneys is unremarkable on today's non contrast MRI examination.  IMPRESSION: 1. No intra or extrahepatic biliary ductal dilatation. 2. Periportal edema in the liver. 3. Small amount of increased T2 signal intensity in the retroperitoneum adjacent to the pancreas, presumably secondary to inflammatory changes given the patient's elevated lipase.   Electronically Signed   By: Trudie Reed M.D.   On: 09/05/2014 19:01   Mr 3d Recon At Scanner  09/05/2014   CLINICAL DATA:  Pregnant patient with nausea, vomiting and right-sided chest pain. Shortness of breath. Right upper quadrant abdominal pain. Elevated liver function tests and elevated lipase.  EXAM: MRI ABDOMEN WITHOUT CONTRAST  (INCLUDING MRCP)  TECHNIQUE: Multiplanar multisequence MR imaging of the abdomen was performed. Heavily T2-weighted images of the biliary and pancreatic ducts were obtained, and three-dimensional MRCP images were rendered by post processing.  COMPARISON:  None.  FINDINGS: No focal hepatic lesions are noted. There is diffuse increased T2 signal intensity adjacent to the portal triads, compatible with periportal edema. MRCP images demonstrate no intra or extrahepatic biliary ductal dilatation. The common bile duct is normal in caliber measuring 3 mm in the porta hepatis. Pancreatic duct is not dilated. The gallbladder does not appear distended. No filling defects within the gallbladder or within the common bile duct to suggest presence of cholelithiasis or choledocholithiasis.  There is a small amount of increased T2 signal intensity in the retroperitoneum adjacent to the pancreas. No well-defined peripancreatic fluid collection is  noted. The appearance of the spleen, bilateral adrenal glands and bilateral kidneys is unremarkable on today's non contrast MRI examination.  IMPRESSION: 1. No intra or extrahepatic biliary ductal dilatation. 2. Periportal edema in the liver. 3. Small amount of increased T2 signal intensity in the retroperitoneum adjacent to the pancreas, presumably secondary to inflammatory changes given the patient's elevated lipase.   Electronically Signed   By: Trudie Reed M.D.   On: 09/05/2014 19:01   [place] Mfm Fetal Nuchal Translucency  09/02/2014   OBSTETRICAL ULTRASOUND: This exam was performed within a [facility]. The OB US report was generated in the [facility] system, and faxed to the ordering physician.   This report is available in the [employer]. See the [facility] Obstetric US report via the Image Link.   Medications: Scheduled Meds: . cefTRIAXone (ROCEPHIN)  IV  1 g Intravenous QHS  . enoxaparin (LOVENOX) injection  40 mg Subcutaneous Q24H  . pantoprazole (PROTONIX) IV  20 mg Intravenous BID      LOS: 2 days   Saqib Cazarez M.D. [facility] 09/06/2014, 11:58 AM Pager: [phone]  If 7PM-7AM, please contact night-coverage [email or web address] Password [email or web address]

## 2014-09-06 NOTE — Progress Notes (Addendum)
[facility] Progress Note    Since last GI note: MRCP yesterday shows no CBD stone, no CBD dilation.  She is hungry, would like to try eating at lest liquids.  Objective: Vital signs in last 24 hours: Temp:  [97.8 F (36.6 C)-98.3 F (36.8 C)] 98.3 F (36.8 C) (09/22 0536) Pulse Rate:  [92-94] 92 (09/22 0536) Resp:  [16-18] 16 (09/22 0536) BP: (106-115)/(56-69) 106/56 mmHg (09/22 0536) SpO2:  [100 %] 100 % (09/22 0536) Last BM Date:  (Per pt last BM was a month ago) General: alert and oriented times 3 Heart: regular rate and rythm Abdomen: soft, non-tender, non-distended, normal bowel sounds   Lab Results:  Recent Labs  09/04/14 2129 09/05/14 0910  WBC 6.7 6.5  HGB 13.8 9.4*  PLT 201 171  MCV 87.3 88.2    Recent Labs  09/04/14 2129 09/05/14 0910 09/06/14 0406  NA 134* 134* 137  K 3.0* 3.5* 3.2*  CL 81* 96 103  CO2 GLUCOSE 88 84 62*  BUN CREATININE 0.79 0.69 0.57  CALCIUM 10.9* 8.4 8.5    Recent Labs  09/04/14 2129 09/05/14 0910 09/06/14 0406  PROT 9.0* 6.1 5.1*  ALBUMIN 4.2 2.8* 2.3*  AST 187* 121* 70*  ALT 428* 278* 201*  ALKPHOS 86 57 47  BILITOT 5.4* 3.2* 1.4*   No results found for this basename: INR,  in the last 72 hours   Studies/Results: US Abdomen Complete  09/05/2014   CLINICAL DATA:  Elevated lipase and T bili, emesis during pregnancy.  EXAM: ULTRASOUND ABDOMEN COMPLETE  COMPARISON:  None.  FINDINGS: Gallbladder:  Multiple punctate echogenic foci central within the gallbladder. No gallbladder wall thickening or pericholecystic fluid. No sonographic Murphy's sign elicited.  Common bile duct:  Diameter: 2 mm  Liver:  No focal lesion identified. Within normal limits in parenchymal echogenicity.  IVC:  No abnormality visualized.  Pancreas:  Visualized portion unremarkable.  Spleen:  Size and appearance within normal limits.  Right Kidney:  Length: 10.4 cm. Echogenicity within normal limits. No mass or  hydronephrosis visualized.  Left Kidney:  Length: 10.1 cm. Echogenicity within normal limits. No mass or hydronephrosis visualized.  Abdominal aorta:  No aneurysm visualized.  Other findings:  None.  IMPRESSION: Gallbladder sludge without sonographic findings of acute cholecystitis.   Electronically Signed   By: Awilda Metro   On: 09/05/2014 00:42   Mr Abdomen Mrcp Wo Cm  09/05/2014   CLINICAL DATA:  Pregnant patient with nausea, vomiting and right-sided chest pain. Shortness of breath. Right upper quadrant abdominal pain. Elevated liver function tests and elevated lipase.  EXAM: MRI ABDOMEN WITHOUT CONTRAST  (INCLUDING MRCP)  TECHNIQUE: Multiplanar multisequence MR imaging of the abdomen was performed. Heavily T2-weighted images of the biliary and pancreatic ducts were obtained, and three-dimensional MRCP images were rendered by post processing.  COMPARISON:  None.  FINDINGS: No focal hepatic lesions are noted. There is diffuse increased T2 signal intensity adjacent to the portal triads, compatible with periportal edema. MRCP images demonstrate no intra or extrahepatic biliary ductal dilatation. The common bile duct is normal in caliber measuring 3 mm in the porta hepatis. Pancreatic duct is not dilated. The gallbladder does not appear distended. No filling defects within the gallbladder or within the common bile duct to suggest presence of cholelithiasis or choledocholithiasis.  There is a small amount of increased T2 signal intensity in the retroperitoneum adjacent to the pancreas. No well-defined peripancreatic fluid collection is  noted. The appearance of the spleen, bilateral adrenal glands and bilateral kidneys is unremarkable on today's non contrast MRI examination.  IMPRESSION: 1. No intra or extrahepatic biliary ductal dilatation. 2. Periportal edema in the liver. 3. Small amount of increased T2 signal intensity in the retroperitoneum adjacent to the pancreas, presumably secondary to inflammatory  changes given the patient's elevated lipase.   Electronically Signed   By: Trudie Reed M.D.   On: 09/05/2014 19:01   Mr 3d Recon At Scanner  09/05/2014   CLINICAL DATA:  Pregnant patient with nausea, vomiting and right-sided chest pain. Shortness of breath. Right upper quadrant abdominal pain. Elevated liver function tests and elevated lipase.  EXAM: MRI ABDOMEN WITHOUT CONTRAST  (INCLUDING MRCP)  TECHNIQUE: Multiplanar multisequence MR imaging of the abdomen was performed. Heavily T2-weighted images of the biliary and pancreatic ducts were obtained, and three-dimensional MRCP images were rendered by post processing.  COMPARISON:  None.  FINDINGS: No focal hepatic lesions are noted. There is diffuse increased T2 signal intensity adjacent to the portal triads, compatible with periportal edema. MRCP images demonstrate no intra or extrahepatic biliary ductal dilatation. The common bile duct is normal in caliber measuring 3 mm in the porta hepatis. Pancreatic duct is not dilated. The gallbladder does not appear distended. No filling defects within the gallbladder or within the common bile duct to suggest presence of cholelithiasis or choledocholithiasis.  There is a small amount of increased T2 signal intensity in the retroperitoneum adjacent to the pancreas. No well-defined peripancreatic fluid collection is noted. The appearance of the spleen, bilateral adrenal glands and bilateral kidneys is unremarkable on today's non contrast MRI examination.  IMPRESSION: 1. No intra or extrahepatic biliary ductal dilatation. 2. Periportal edema in the liver. 3. Small amount of increased T2 signal intensity in the retroperitoneum adjacent to the pancreas, presumably secondary to inflammatory changes given the patient's elevated lipase.   Electronically Signed   By: Trudie Reed M.D.   On: 09/05/2014 19:01     Medications: Scheduled Meds: . cefTRIAXone (ROCEPHIN)  IV  1 g Intravenous QHS  . pantoprazole (PROTONIX)  IV  20 mg Intravenous BID   Continuous Infusions: . dextrose 5 % and 0.45% NaCl     PRN Meds:.ondansetron (ZOFRAN) IV, ondansetron, promethazine, promethazine, promethazine    Assessment/Plan: 23 y.o. female with improving biliary pancreatitis  LFTs much improved, MRCP suggests against persistent CBD pathology.  She will need GB resection, timing per surgery team.  I will advance to clear liquids today (her acute pancreatitis is resolving).    Please call or page with any further questions or concerns.   Rachael Fee, MD  09/06/2014, 7:59 AM [facility] Pager [phone]

## 2014-09-07 DIAGNOSIS — D649 Anemia, unspecified: Secondary | ICD-10-CM

## 2014-09-07 LAB — COMPREHENSIVE METABOLIC PANEL
ALK PHOS: 47 U/L (ref 39–117)
ALT: 173 U/L — ABNORMAL HIGH (ref 0–35)
ANION GAP: 11 (ref 5–15)
AST: 55 U/L — ABNORMAL HIGH (ref 0–37)
Albumin: 2.3 g/dL — ABNORMAL LOW (ref 3.5–5.2)
BUN: 4 mg/dL — AB (ref 6–23)
CO2: 20 mEq/L (ref 19–32)
CREATININE: 0.48 mg/dL — AB (ref 0.50–1.10)
Calcium: 8.3 mg/dL — ABNORMAL LOW (ref 8.4–10.5)
Chloride: 103 mEq/L (ref 96–112)
GFR calc Af Amer: >90 mL/min (ref >90)
GFR calc non Af Amer: >90 mL/min (ref >90)
GLUCOSE: 97 mg/dL (ref 70–99)
Potassium: 3.3 mEq/L — ABNORMAL LOW (ref 3.7–5.3)
Sodium: 134 mEq/L — ABNORMAL LOW (ref 137–147)
TOTAL PROTEIN: 5.1 g/dL — AB (ref 6.0–8.3)
Total Bilirubin: 0.9 mg/dL (ref 0.3–1.2)

## 2014-09-07 LAB — CBC
HCT: 21.3 % — ABNORMAL LOW (ref 36.0–46.0)
HEMOGLOBIN: 7.7 g/dL — AB (ref 12.0–15.0)
MCH: 31.4 pg (ref 26.0–34.0)
MCHC: 36.2 g/dL — AB (ref 30.0–36.0)
MCV: 86.9 fL (ref 78.0–100.0)
Platelets: 155 10*3/uL (ref 150–400)
RBC: 2.45 MIL/uL — ABNORMAL LOW (ref 3.87–5.11)
RDW: 12.6 % (ref 11.5–15.5)
WBC: 4.9 10*3/uL (ref 4.0–10.5)

## 2014-09-07 LAB — LIPASE, BLOOD: Lipase: 584 U/L — ABNORMAL HIGH (ref 11–59)

## 2014-09-07 LAB — ABO/RH: ABO/RH(D): A POS

## 2014-09-07 LAB — PREPARE RBC (CROSSMATCH)

## 2014-09-07 MED ORDER — PRENATAL MULTIVITAMIN CH
1.0000 | ORAL_TABLET | Freq: Every day | ORAL | Status: DC
  Administered 2014-09-07 – 2014-09-10 (×4): 1 via ORAL
  Filled 2014-09-07 (×4): qty 1

## 2014-09-07 MED ORDER — POTASSIUM CHLORIDE CRYS ER 20 MEQ PO TBCR
40.0000 meq | EXTENDED_RELEASE_TABLET | Freq: Once | ORAL | Status: AC
  Administered 2014-09-07: 40 meq via ORAL
  Filled 2014-09-07: qty 2

## 2014-09-07 MED ORDER — METOCLOPRAMIDE HCL 5 MG/ML IJ SOLN
10.0000 mg | Freq: Once | INTRAMUSCULAR | Status: AC
  Administered 2014-09-07: 10 mg via INTRAVENOUS
  Filled 2014-09-07: qty 2

## 2014-09-07 MED ORDER — DIPHENHYDRAMINE HCL 50 MG/ML IJ SOLN
25.0000 mg | Freq: Once | INTRAMUSCULAR | Status: AC
  Administered 2014-09-07: 25 mg via INTRAVENOUS
  Filled 2014-09-07: qty 1

## 2014-09-07 MED ORDER — SODIUM CHLORIDE 0.9 % IV SOLN
Freq: Once | INTRAVENOUS | Status: AC
  Administered 2014-09-07: 15:00:00 via INTRAVENOUS

## 2014-09-07 MED ORDER — FERROUS GLUCONATE 324 (38 FE) MG PO TABS
324.0000 mg | ORAL_TABLET | Freq: Two times a day (BID) | ORAL | Status: DC
  Administered 2014-09-07 – 2014-09-10 (×6): 324 mg via ORAL
  Filled 2014-09-07 (×9): qty 1

## 2014-09-07 NOTE — Progress Notes (Signed)
Patient ID: Toni Romero  female  UVO:[id number]    DOB: 04-04-1991    DOA: 09/04/2014  PCP: No PCP Per Patient  Assessment/Plan: Principal Problem: Nausea and vomiting during pregnancy worsened likely due to acute pancreatitis, choledocholithiasis with obstruction - Continue clears, Gi, surgery following. MRCP negative for CBD stone or CBD dilatation - tolerating clears, decision about laparoscopic cholecystectomy by surgery - Continue IV antiemetics and fluids - Lipase LFTs trending down  Elevated liver function tests and lipase  - Due to #1, trending down  Anemia: Possibly worsened due to hemodilution - Will transfuse 2 units packed RBCs - Started on prenatal vitamin and iron supplement  Urinary tract infection  - Continue IV Rocephin, urine culture and sensitivities showed 75,000 colonies given her pregnancy, will continue IV Rocephin for total of 7 days  Second trimester Pregnancy  - Consider to monitor. Depending on patient's clinical course may contact her OB physician Dr. Jolayne Panther for further evaluation.   Hypokalemia and hypo-magnesimia - Replaced  DVT Prophylaxis: Lovenox  Code Status: Full code  Family Communication: Discussed with patient  Disposition:  Consultants:  Surgery  Gastroenterology  Procedures:  None  Antibiotics:  IV Rocephin    Subjective: Patient seen and examined, denies any specific complaints, tolerating clears, lipase improving  Objective: Weight change:   Intake/Output Summary (Last 24 hours) at 09/07/14 1229 Last data filed at 09/07/14 0704  Gross per 24 hour  Intake 3165.08 ml  Output    800 ml  Net 2365.08 ml   Blood pressure 113/61, pulse 81, temperature 97.9 F (36.6 C), temperature source Oral, resp. rate 16, height  (1.626 m), weight 57.108 kg (125 lb 14.4 oz), last menstrual period 06/07/2014, SpO2 100.00%, unknown if currently breastfeeding.  Physical Exam: General: Alert and awake, oriented x3, not  in any acute distress. CVS: S1-S2 clear, no murmur rubs or gallops Chest: clear to auscultation bilaterally, no wheezing, rales or rhonchi Abdomen: soft nontender, nondistended, normal bowel sounds  Extremities: no cyanosis, clubbing or edema noted bilaterally   Lab Results: Basic Metabolic Panel:  Recent Labs Lab 09/06/14 0406 09/07/14 0340  NA 137 134*  K 3.2* 3.3*  CL 103 103  CO2 20 20  GLUCOSE 62* 97  BUN 9 4*  CREATININE 0.57 0.48*  CALCIUM 8.5 8.3*  MG 1.7  --    Liver Function Tests:  Recent Labs Lab 09/06/14 0406 09/07/14 0340  AST 70* 55*  ALT 201* 173*  ALKPHOS 47 47  BILITOT 1.4* 0.9  PROT 5.1* 5.1*  ALBUMIN 2.3* 2.3*    Recent Labs Lab 09/06/14 0406 09/07/14 0340  LIPASE 673* 584*   No results found for this basename: AMMONIA,  in the last 168 hours CBC:  Recent Labs Lab 09/04/14 2129 09/05/14 0910 09/07/14 0340  WBC 6.7 6.5 4.9  NEUTROABS 4.7  --   --   HGB 13.8 9.4* 7.7*  HCT 37.1 26.1* 21.3*  MCV 87.3 88.2 86.9  PLT 201 171 155   Cardiac Enzymes: No results found for this basename: CKTOTAL, CKMB, CKMBINDEX, TROPONINI,  in the last 168 hours BNP: No components found with this basename: POCBNP,  CBG: No results found for this basename: GLUCAP,  in the last 168 hours   Micro Results: Recent Results (from the past 240 hour(s))  URINE CULTURE     Status: None   Collection Time    09/05/14  2:22 AM      Result Value Ref Range Status   Specimen  Description URINE, RANDOM   Final   Special Requests Normal   Final   Culture  Setup Time     Final   Value: 09/05/2014 10:18     Performed at [facility] Count     Final   Value: 75,000 COLONIES/ML     Performed at [facility]   Culture     Final   Value: Multiple bacterial morphotypes present, none predominant. Suggest appropriate recollection if clinically indicated.     Performed at [facility]   Report Status 09/06/2014 FINAL   Final     Studies/Results: US Abdomen Complete  09/05/2014   CLINICAL DATA:  Elevated lipase and T bili, emesis during pregnancy.  EXAM: ULTRASOUND ABDOMEN COMPLETE  COMPARISON:  None.  FINDINGS: Gallbladder:  Multiple punctate echogenic foci central within the gallbladder. No gallbladder wall thickening or pericholecystic fluid. No sonographic Murphy's sign elicited.  Common bile duct:  Diameter: 2 mm  Liver:  No focal lesion identified. Within normal limits in parenchymal echogenicity.  IVC:  No abnormality visualized.  Pancreas:  Visualized portion unremarkable.  Spleen:  Size and appearance within normal limits.  Right Kidney:  Length: 10.4 cm. Echogenicity within normal limits. No mass or hydronephrosis visualized.  Left Kidney:  Length: 10.1 cm. Echogenicity within normal limits. No mass or hydronephrosis visualized.  Abdominal aorta:  No aneurysm visualized.  Other findings:  None.  IMPRESSION: Gallbladder sludge without sonographic findings of acute cholecystitis.   Electronically Signed   By: Awilda Metro   On: 09/05/2014 00:42   Mr Abdomen Mrcp Wo Cm  09/05/2014   CLINICAL DATA:  Pregnant patient with nausea, vomiting and right-sided chest pain. Shortness of breath. Right upper quadrant abdominal pain. Elevated liver function tests and elevated lipase.  EXAM: MRI ABDOMEN WITHOUT CONTRAST  (INCLUDING MRCP)  TECHNIQUE: Multiplanar multisequence MR imaging of the abdomen was performed. Heavily T2-weighted images of the biliary and pancreatic ducts were obtained, and three-dimensional MRCP images were rendered by post processing.  COMPARISON:  None.  FINDINGS: No focal hepatic lesions are noted. There is diffuse increased T2 signal intensity adjacent to the portal triads, compatible with periportal edema. MRCP images demonstrate no intra or extrahepatic biliary ductal dilatation. The common bile duct is normal in caliber measuring 3 mm in the porta hepatis. Pancreatic duct is not dilated. The gallbladder  does not appear distended. No filling defects within the gallbladder or within the common bile duct to suggest presence of cholelithiasis or choledocholithiasis.  There is a small amount of increased T2 signal intensity in the retroperitoneum adjacent to the pancreas. No well-defined peripancreatic fluid collection is noted. The appearance of the spleen, bilateral adrenal glands and bilateral kidneys is unremarkable on today's non contrast MRI examination.  IMPRESSION: 1. No intra or extrahepatic biliary ductal dilatation. 2. Periportal edema in the liver. 3. Small amount of increased T2 signal intensity in the retroperitoneum adjacent to the pancreas, presumably secondary to inflammatory changes given the patient's elevated lipase.   Electronically Signed   By: Trudie Reed M.D.   On: 09/05/2014 19:01   Mr 3d Recon At Scanner  09/05/2014   CLINICAL DATA:  Pregnant patient with nausea, vomiting and right-sided chest pain. Shortness of breath. Right upper quadrant abdominal pain. Elevated liver function tests and elevated lipase.  EXAM: MRI ABDOMEN WITHOUT CONTRAST  (INCLUDING MRCP)  TECHNIQUE: Multiplanar multisequence MR imaging of the abdomen was performed. Heavily T2-weighted images of the biliary and pancreatic ducts  were obtained, and three-dimensional MRCP images were rendered by post processing.  COMPARISON:  None.  FINDINGS: No focal hepatic lesions are noted. There is diffuse increased T2 signal intensity adjacent to the portal triads, compatible with periportal edema. MRCP images demonstrate no intra or extrahepatic biliary ductal dilatation. The common bile duct is normal in caliber measuring 3 mm in the porta hepatis. Pancreatic duct is not dilated. The gallbladder does not appear distended. No filling defects within the gallbladder or within the common bile duct to suggest presence of cholelithiasis or choledocholithiasis.  There is a small amount of increased T2 signal intensity in the  retroperitoneum adjacent to the pancreas. No well-defined peripancreatic fluid collection is noted. The appearance of the spleen, bilateral adrenal glands and bilateral kidneys is unremarkable on today's non contrast MRI examination.  IMPRESSION: 1. No intra or extrahepatic biliary ductal dilatation. 2. Periportal edema in the liver. 3. Small amount of increased T2 signal intensity in the retroperitoneum adjacent to the pancreas, presumably secondary to inflammatory changes given the patient's elevated lipase.   Electronically Signed   By: Trudie Reed M.D.   On: 09/05/2014 19:01   [place] Mfm Fetal Nuchal Translucency  09/02/2014   OBSTETRICAL ULTRASOUND: This exam was performed within a [facility]. The OB US report was generated in the [facility] system, and faxed to the ordering physician.   This report is available in the [employer]. See the [facility] Obstetric US report via the Image Link.   Medications: Scheduled Meds: . sodium chloride   Intravenous Once  . cefTRIAXone (ROCEPHIN)  IV  1 g Intravenous QHS  . enoxaparin (LOVENOX) injection  40 mg Subcutaneous Q24H  . feeding supplement (RESOURCE BREEZE)  1 Container Oral TID BM  . ferrous gluconate  324 mg Oral BID WC  . magnesium oxide  400 mg Oral BID  . pantoprazole (PROTONIX) IV  20 mg Intravenous BID  . prenatal multivitamin  1 tablet Oral Q1200      LOS: 3 days   Izick Gasbarro M.D. [facility] 09/07/2014, 12:29 PM Pager: [phone]  If 7PM-7AM, please contact night-coverage [email or web address] Password [email or web address]

## 2014-09-07 NOTE — Progress Notes (Signed)
Patient ID: Toni Romero, female   DOB: 12-10-1991, 23 y.o.   MRN: [id number]    Subjective: Pt feels a little better today.  Less nausea.  Tolerating some clear liquids.  Had a small amount of emesis.  Objective: Vital signs in last 24 hours: Temp:  [97.9 F (36.6 C)-98.4 F (36.9 C)] 97.9 F (36.6 C) (09/23 0524) Pulse Rate:  [81-97] 81 (09/23 0524) Resp:  [15-16] 16 (09/23 0524) BP: (113-119)/(61-67) 113/61 mmHg (09/23 0524) SpO2:  [100 %] 100 % (09/23 0524) Weight:  [125 lb 14.4 oz (57.108 kg)] 125 lb 14.4 oz (57.108 kg) (09/23 0524) Last BM Date:  (Per pt last BM was a month ago)  Intake/Output from previous day: 09/22 0701 - 09/23 0700 In: 2302.1 [P.O.:860; I.V.:1442.1] Out: 800 [Urine:800] Intake/Output this shift: Total I/O In: 863 [I.V.:863] Out: -   PE: Abd: soft, NT in LUQ, but mildly tender in RUQ, few BS, gravid below umbilicus  Lab Results:   Recent Labs  09/05/14 0910 09/07/14 0340  WBC 6.5 4.9  HGB 9.4* 7.7*  HCT 26.1* 21.3*  PLT 171 155   BMET  Recent Labs  09/06/14 0406 09/07/14 0340  NA 137 134*  K 3.2* 3.3*  CL 103 103  CO2 20 20  GLUCOSE 62* 97  BUN 9 4*  CREATININE 0.57 0.48*  CALCIUM 8.5 8.3*   PT/INR No results found for this basename: LABPROT, INR,  in the last 72 hours CMP     Component Value Date/Time   NA 134* 09/07/2014 0340   K 3.3* 09/07/2014 0340   CL 103 09/07/2014 0340   CO2 20 09/07/2014 0340   GLUCOSE 97 09/07/2014 0340   BUN 4* 09/07/2014 0340   CREATININE 0.48* 09/07/2014 0340   CALCIUM 8.3* 09/07/2014 0340   PROT 5.1* 09/07/2014 0340   ALBUMIN 2.3* 09/07/2014 0340   AST 55* 09/07/2014 0340   ALT 173* 09/07/2014 0340   ALKPHOS 47 09/07/2014 0340   BILITOT 0.9 09/07/2014 0340   GFRNONAA >90 09/07/2014 0340   GFRAA >90 09/07/2014 0340   Lipase     Component Value Date/Time   LIPASE 584* 09/07/2014 0340       Studies/Results: Mr Abdomen Mrcp Wo Cm  09/05/2014   CLINICAL DATA:  Pregnant patient with nausea,  vomiting and right-sided chest pain. Shortness of breath. Right upper quadrant abdominal pain. Elevated liver function tests and elevated lipase.  EXAM: MRI ABDOMEN WITHOUT CONTRAST  (INCLUDING MRCP)  TECHNIQUE: Multiplanar multisequence MR imaging of the abdomen was performed. Heavily T2-weighted images of the biliary and pancreatic ducts were obtained, and three-dimensional MRCP images were rendered by post processing.  COMPARISON:  None.  FINDINGS: No focal hepatic lesions are noted. There is diffuse increased T2 signal intensity adjacent to the portal triads, compatible with periportal edema. MRCP images demonstrate no intra or extrahepatic biliary ductal dilatation. The common bile duct is normal in caliber measuring 3 mm in the porta hepatis. Pancreatic duct is not dilated. The gallbladder does not appear distended. No filling defects within the gallbladder or within the common bile duct to suggest presence of cholelithiasis or choledocholithiasis.  There is a small amount of increased T2 signal intensity in the retroperitoneum adjacent to the pancreas. No well-defined peripancreatic fluid collection is noted. The appearance of the spleen, bilateral adrenal glands and bilateral kidneys is unremarkable on today's non contrast MRI examination.  IMPRESSION: 1. No intra or extrahepatic biliary ductal dilatation. 2. Periportal edema in the liver. 3. Small  amount of increased T2 signal intensity in the retroperitoneum adjacent to the pancreas, presumably secondary to inflammatory changes given the patient's elevated lipase.   Electronically Signed   By: Trudie Reed M.D.   On: 09/05/2014 19:01   Mr 3d Recon At Scanner  09/05/2014   CLINICAL DATA:  Pregnant patient with nausea, vomiting and right-sided chest pain. Shortness of breath. Right upper quadrant abdominal pain. Elevated liver function tests and elevated lipase.  EXAM: MRI ABDOMEN WITHOUT CONTRAST  (INCLUDING MRCP)  TECHNIQUE: Multiplanar  multisequence MR imaging of the abdomen was performed. Heavily T2-weighted images of the biliary and pancreatic ducts were obtained, and three-dimensional MRCP images were rendered by post processing.  COMPARISON:  None.  FINDINGS: No focal hepatic lesions are noted. There is diffuse increased T2 signal intensity adjacent to the portal triads, compatible with periportal edema. MRCP images demonstrate no intra or extrahepatic biliary ductal dilatation. The common bile duct is normal in caliber measuring 3 mm in the porta hepatis. Pancreatic duct is not dilated. The gallbladder does not appear distended. No filling defects within the gallbladder or within the common bile duct to suggest presence of cholelithiasis or choledocholithiasis.  There is a small amount of increased T2 signal intensity in the retroperitoneum adjacent to the pancreas. No well-defined peripancreatic fluid collection is noted. The appearance of the spleen, bilateral adrenal glands and bilateral kidneys is unremarkable on today's non contrast MRI examination.  IMPRESSION: 1. No intra or extrahepatic biliary ductal dilatation. 2. Periportal edema in the liver. 3. Small amount of increased T2 signal intensity in the retroperitoneum adjacent to the pancreas, presumably secondary to inflammatory changes given the patient's elevated lipase.   Electronically Signed   By: Trudie Reed M.D.   On: 09/05/2014 19:01    Anti-infectives: Anti-infectives   Start     Dose/Rate Route Frequency Ordered Stop   09/05/14 0330  cefTRIAXone (ROCEPHIN) 1 g in dextrose 5 % 50 mL IVPB     1 g 100 mL/hr over 30 Minutes Intravenous Daily at bedtime 09/05/14 0254         Assessment/Plan  1. Gallstone pancreatitis 2. 13 weeks gravid 3. Anemia   Plan: 1. Lipase remains elevated still.  Will continue to follow this.  LFTs trending down.  TB now normalized.   2. Will still try to plan on surgery at some point this week, but would like pancreatitis to  improve overall prior to surgery. 3. Fetal heart tone at 153bpm 4. Patient's hgb trending down to 7.7 this am from 9.4 two days ago.  May be some dilutional and some from pregnancy, but seems a little low.  Await medicine's evaluation to feel if she may need a unit of pRBCs.  LOS: 3 days    Josaiah Muhammed E 09/07/2014, 7:34 AM Pager: [phone]

## 2014-09-07 NOTE — Progress Notes (Signed)
Still with some mild emesis, but much improved since before admission Tolerating clears Pancreatitis resolving very slowly  Consider transfusion per primary team Probable lap chole when pancreatitis has resolved.  Wilmon Arms. Corliss Skains, MD, [facility] Surgery  General/ Trauma Surgery  09/07/2014 9:18 AM

## 2014-09-08 LAB — TYPE AND SCREEN
ABO/RH(D): A POS
Antibody Screen: NEGATIVE
UNIT DIVISION: 0
Unit division: 0

## 2014-09-08 LAB — COMPREHENSIVE METABOLIC PANEL
ALT: 136 U/L — ABNORMAL HIGH (ref 0–35)
ANION GAP: 11 (ref 5–15)
AST: 34 U/L (ref 0–37)
Albumin: 2.3 g/dL — ABNORMAL LOW (ref 3.5–5.2)
Alkaline Phosphatase: 45 U/L (ref 39–117)
BILIRUBIN TOTAL: 1.1 mg/dL (ref 0.3–1.2)
BUN: <3 mg/dL — ABNORMAL LOW (ref 6–23)
CALCIUM: 8.3 mg/dL — AB (ref 8.4–10.5)
CO2: 21 mEq/L (ref 19–32)
CREATININE: 0.45 mg/dL — AB (ref 0.50–1.10)
Chloride: 106 mEq/L (ref 96–112)
GFR calc Af Amer: >90 mL/min (ref >90)
GFR calc non Af Amer: >90 mL/min (ref >90)
GLUCOSE: 84 mg/dL (ref 70–99)
Potassium: 3.5 mEq/L — ABNORMAL LOW (ref 3.7–5.3)
Sodium: 138 mEq/L (ref 137–147)
Total Protein: 5.3 g/dL — ABNORMAL LOW (ref 6.0–8.3)

## 2014-09-08 LAB — LIPASE, BLOOD: Lipase: 442 U/L — ABNORMAL HIGH (ref 11–59)

## 2014-09-08 LAB — CBC
HEMATOCRIT: 27.9 % — AB (ref 36.0–46.0)
HEMOGLOBIN: 10.1 g/dL — AB (ref 12.0–15.0)
MCH: 31.2 pg (ref 26.0–34.0)
MCHC: 36.2 g/dL — ABNORMAL HIGH (ref 30.0–36.0)
MCV: 86.1 fL (ref 78.0–100.0)
Platelets: 151 10*3/uL (ref 150–400)
RBC: 3.24 MIL/uL — AB (ref 3.87–5.11)
RDW: 14 % (ref 11.5–15.5)
WBC: 5.3 10*3/uL (ref 4.0–10.5)

## 2014-09-08 MED ORDER — POTASSIUM CHLORIDE CRYS ER 20 MEQ PO TBCR
40.0000 meq | EXTENDED_RELEASE_TABLET | Freq: Once | ORAL | Status: AC
  Administered 2014-09-08: 40 meq via ORAL
  Filled 2014-09-08: qty 2

## 2014-09-08 NOTE — Progress Notes (Signed)
Patient ID: Toni Romero  female  BJY:[id number]    DOB: 11/16/1991    DOA: 09/04/2014  PCP: No PCP Per Patient  Assessment/Plan: Principal Problem: Nausea and vomiting during pregnancy worsened likely due to acute pancreatitis, choledocholithiasis with obstruction - Continue clears, GI, surgery following. MRCP negative for CBD stone or CBD dilatation - tolerating clears, surgery planning about lap chole in am   - Continue IV antiemetics and fluids - Lipase LFTs trending down  Elevated liver function tests and lipase  - Due to #1, trending down  Anemia: Possibly worsened due to hemodilution - transfused 2 units packed RBCs, h/h 10.1/27.9 - Started on prenatal vitamin and iron supplement  Urinary tract infection  - Continue IV Rocephin, urine culture and sensitivities showed 75,000 colonies given her pregnancy, will continue IV Rocephin for total of 7 days  Second trimester Pregnancy  - Consider to monitor. Depending on patient's clinical course may contact her OB physician Dr. Jolayne Panther for further evaluation.   Hypokalemia  - Replaced  DVT Prophylaxis: Lovenox  Code Status: Full code  Family Communication: Discussed with patient, her mother, fiance  Disposition:  Consultants:  Surgery  Gastroenterology  Procedures:  None  Antibiotics:  IV Rocephin    Subjective: Patient seen and examined, tolerating clears, no nausea/vomiting  Objective: Weight change: -0.463 kg (-1 lb 0.3 oz)  Intake/Output Summary (Last 24 hours) at 09/08/14 1232 Last data filed at 09/07/14 2020  Gross per 24 hour  Intake 968.33 ml  Output      0 ml  Net 968.33 ml   Blood pressure 121/67, pulse 76, temperature 98.1 F (36.7 C), temperature source Oral, resp. rate 17, height  (1.626 m), weight 56.645 kg (124 lb 14.1 oz), last menstrual period 06/07/2014, SpO2 100.00%, unknown if currently breastfeeding.  Physical Exam: General: Alert and awake, oriented x3, NAD CVS: S1-S2  clear, no murmur rubs or gallops Chest: clear to auscultation bilaterally, no wheezing, rales or rhonchi Abdomen: soft nontender, nondistended, normal bowel sounds  Extremities: no cyanosis, clubbing or edema noted bilaterally   Lab Results: Basic Metabolic Panel:  Recent Labs Lab 09/06/14 0406 09/07/14 0340 09/08/14 0540  NA 137 134* 138  K 3.2* 3.3* 3.5*  CL 103 103 106  CO2 GLUCOSE 62* 97 84  BUN 9 4* <3*  CREATININE 0.57 0.48* 0.45*  CALCIUM 8.5 8.3* 8.3*  MG 1.7  --   --    Liver Function Tests:  Recent Labs Lab 09/07/14 0340 09/08/14 0540  AST 55* 34  ALT 173* 136*  ALKPHOS 47 45  BILITOT 0.9 1.1  PROT 5.1* 5.3*  ALBUMIN 2.3* 2.3*    Recent Labs Lab 09/07/14 0340 09/08/14 0540  LIPASE 584* 442*   No results found for this basename: AMMONIA,  in the last 168 hours CBC:  Recent Labs Lab 09/04/14 2129  09/07/14 0340 09/08/14 0540  WBC 6.7  < > 4.9 5.3  NEUTROABS 4.7  --   --   --   HGB 13.8  < > 7.7* 10.1*  HCT 37.1  < > 21.3* 27.9*  MCV 87.3  < > 86.9 86.1  PLT 201  < > 155 151  < > = values in this interval not displayed. Cardiac Enzymes: No results found for this basename: CKTOTAL, CKMB, CKMBINDEX, TROPONINI,  in the last 168 hours BNP: No components found with this basename: POCBNP,  CBG: No results found for this basename: GLUCAP,  in the last 168  hours   Micro Results: Recent Results (from the past 240 hour(s))  URINE CULTURE     Status: None   Collection Time    09/05/14  2:22 AM      Result Value Ref Range Status   Specimen Description URINE, RANDOM   Final   Special Requests Normal   Final   Culture  Setup Time     Final   Value: 09/05/2014 10:18     Performed at [facility] Count     Final   Value: 75,000 COLONIES/ML     Performed at [facility]   Culture     Final   Value: Multiple bacterial morphotypes present, none predominant. Suggest appropriate recollection if clinically  indicated.     Performed at [facility]   Report Status 09/06/2014 FINAL   Final    Studies/Results: US Abdomen Complete  09/05/2014   CLINICAL DATA:  Elevated lipase and T bili, emesis during pregnancy.  EXAM: ULTRASOUND ABDOMEN COMPLETE  COMPARISON:  None.  FINDINGS: Gallbladder:  Multiple punctate echogenic foci central within the gallbladder. No gallbladder wall thickening or pericholecystic fluid. No sonographic Murphy's sign elicited.  Common bile duct:  Diameter: 2 mm  Liver:  No focal lesion identified. Within normal limits in parenchymal echogenicity.  IVC:  No abnormality visualized.  Pancreas:  Visualized portion unremarkable.  Spleen:  Size and appearance within normal limits.  Right Kidney:  Length: 10.4 cm. Echogenicity within normal limits. No mass or hydronephrosis visualized.  Left Kidney:  Length: 10.1 cm. Echogenicity within normal limits. No mass or hydronephrosis visualized.  Abdominal aorta:  No aneurysm visualized.  Other findings:  None.  IMPRESSION: Gallbladder sludge without sonographic findings of acute cholecystitis.   Electronically Signed   By: Awilda Metro   On: 09/05/2014 00:42   Mr Abdomen Mrcp Wo Cm  09/05/2014   CLINICAL DATA:  Pregnant patient with nausea, vomiting and right-sided chest pain. Shortness of breath. Right upper quadrant abdominal pain. Elevated liver function tests and elevated lipase.  EXAM: MRI ABDOMEN WITHOUT CONTRAST  (INCLUDING MRCP)  TECHNIQUE: Multiplanar multisequence MR imaging of the abdomen was performed. Heavily T2-weighted images of the biliary and pancreatic ducts were obtained, and three-dimensional MRCP images were rendered by post processing.  COMPARISON:  None.  FINDINGS: No focal hepatic lesions are noted. There is diffuse increased T2 signal intensity adjacent to the portal triads, compatible with periportal edema. MRCP images demonstrate no intra or extrahepatic biliary ductal dilatation. The common bile duct is normal in  caliber measuring 3 mm in the porta hepatis. Pancreatic duct is not dilated. The gallbladder does not appear distended. No filling defects within the gallbladder or within the common bile duct to suggest presence of cholelithiasis or choledocholithiasis.  There is a small amount of increased T2 signal intensity in the retroperitoneum adjacent to the pancreas. No well-defined peripancreatic fluid collection is noted. The appearance of the spleen, bilateral adrenal glands and bilateral kidneys is unremarkable on today's non contrast MRI examination.  IMPRESSION: 1. No intra or extrahepatic biliary ductal dilatation. 2. Periportal edema in the liver. 3. Small amount of increased T2 signal intensity in the retroperitoneum adjacent to the pancreas, presumably secondary to inflammatory changes given the patient's elevated lipase.   Electronically Signed   By: Trudie Reed M.D.   On: 09/05/2014 19:01   Mr 3d Recon At Scanner  09/05/2014   CLINICAL DATA:  Pregnant patient with nausea, vomiting and right-sided chest  pain. Shortness of breath. Right upper quadrant abdominal pain. Elevated liver function tests and elevated lipase.  EXAM: MRI ABDOMEN WITHOUT CONTRAST  (INCLUDING MRCP)  TECHNIQUE: Multiplanar multisequence MR imaging of the abdomen was performed. Heavily T2-weighted images of the biliary and pancreatic ducts were obtained, and three-dimensional MRCP images were rendered by post processing.  COMPARISON:  None.  FINDINGS: No focal hepatic lesions are noted. There is diffuse increased T2 signal intensity adjacent to the portal triads, compatible with periportal edema. MRCP images demonstrate no intra or extrahepatic biliary ductal dilatation. The common bile duct is normal in caliber measuring 3 mm in the porta hepatis. Pancreatic duct is not dilated. The gallbladder does not appear distended. No filling defects within the gallbladder or within the common bile duct to suggest presence of cholelithiasis or  choledocholithiasis.  There is a small amount of increased T2 signal intensity in the retroperitoneum adjacent to the pancreas. No well-defined peripancreatic fluid collection is noted. The appearance of the spleen, bilateral adrenal glands and bilateral kidneys is unremarkable on today's non contrast MRI examination.  IMPRESSION: 1. No intra or extrahepatic biliary ductal dilatation. 2. Periportal edema in the liver. 3. Small amount of increased T2 signal intensity in the retroperitoneum adjacent to the pancreas, presumably secondary to inflammatory changes given the patient's elevated lipase.   Electronically Signed   By: Trudie Reed M.D.   On: 09/05/2014 19:01   [place] Mfm Fetal Nuchal Translucency  09/02/2014   OBSTETRICAL ULTRASOUND: This exam was performed within a [facility]. The OB US report was generated in the [facility] system, and faxed to the ordering physician.   This report is available in the [employer]. See the [facility] Obstetric US report via the Image Link.   Medications: Scheduled Meds: . cefTRIAXone (ROCEPHIN)  IV  1 g Intravenous QHS  . feeding supplement (RESOURCE BREEZE)  1 Container Oral TID BM  . ferrous gluconate  324 mg Oral BID WC  . magnesium oxide  400 mg Oral BID  . pantoprazole (PROTONIX) IV  20 mg Intravenous BID  . prenatal multivitamin  1 tablet Oral Q1200      LOS: 4 days   Cheick Suhr M.D. [facility] 09/08/2014, 12:32 PM Pager: [phone]  If 7PM-7AM, please contact night-coverage [email or web address] Password [email or web address]

## 2014-09-08 NOTE — Progress Notes (Signed)
Patient ID: Toni Romero, female   DOB: 04/11/1991, 23 y.o.   MRN: [id number]    Subjective: Pt looks much better today.  Feels better.  Only had 2 episodes of emesis yesterday.  No pain, except minor after some liquids yesterday  Objective: Vital signs in last 24 hours: Temp:  [97.3 F (36.3 C)-99.2 F (37.3 C)] 98.1 F (36.7 C) (09/24 0601) Pulse Rate:  [76-92] 76 (09/24 0601) Resp:  [16-18] 17 (09/24 0601) BP: (112-129)/(63-77) 121/67 mmHg (09/24 0601) SpO2:  [100 %] 100 % (09/24 0601) Weight:  [124 lb 14.1 oz (56.645 kg)] 124 lb 14.1 oz (56.645 kg) (09/24 0601) Last BM Date: 09/07/14  Intake/Output from previous day: 09/23 0701 - 09/24 0700 In: 1831.3 [I.V.:1113; Blood:718.3] Out: -  Intake/Output this shift:    PE: Abd: soft, NT except minimally in RUQ, ND, except gravid below umbilicus, +BS  Lab Results:   Recent Labs  09/07/14 0340 09/08/14 0540  WBC 4.9 5.3  HGB 7.7* 10.1*  HCT 21.3* 27.9*  PLT 155 151   BMET  Recent Labs  09/07/14 0340 09/08/14 0540  NA 134* 138  K 3.3* 3.5*  CL 103 106  CO2 20 21  GLUCOSE 97 84  BUN 4* <3*  CREATININE 0.48* 0.45*  CALCIUM 8.3* 8.3*   PT/INR No results found for this basename: LABPROT, INR,  in the last 72 hours CMP     Component Value Date/Time   NA 138 09/08/2014 0540   K 3.5* 09/08/2014 0540   CL 106 09/08/2014 0540   CO2 21 09/08/2014 0540   GLUCOSE 84 09/08/2014 0540   BUN <3* 09/08/2014 0540   CREATININE 0.45* 09/08/2014 0540   CALCIUM 8.3* 09/08/2014 0540   PROT 5.3* 09/08/2014 0540   ALBUMIN 2.3* 09/08/2014 0540   AST 34 09/08/2014 0540   ALT 136* 09/08/2014 0540   ALKPHOS 45 09/08/2014 0540   BILITOT 1.1 09/08/2014 0540   GFRNONAA >90 09/08/2014 0540   GFRAA >90 09/08/2014 0540   Lipase     Component Value Date/Time   LIPASE 442* 09/08/2014 0540       Studies/Results: No results found.  Anti-infectives: Anti-infectives   Start     Dose/Rate Route Frequency Ordered Stop   09/05/14 0330   cefTRIAXone (ROCEPHIN) 1 g in dextrose 5 % 50 mL IVPB     1 g 100 mL/hr over 30 Minutes Intravenous Daily at bedtime 09/05/14 0254         Assessment/Plan  1. Gallstone pancreatitis 2. 13Weeks gravid with intrauterine pregnancy 3. Anemia, improved after 2 units  Plan: 1.  Lipase very slowly trending down to 440s, but patient is pain free.  Will have to discuss timing of surgery with Dr. Corliss Skains.  We are still planning on attempting lap chole in the near future.   LOS: 4 days    Trajon Rosete E 09/08/2014, 8:18 AM Pager: [phone]

## 2014-09-08 NOTE — Progress Notes (Signed)
Pancreatitis slowly resolving, but clinically much improved.  Plan lap chole with IOC tomorrow unless change in clinical course.  The surgical procedure has been discussed with the patient.  Potential risks, benefits, alternative treatments, and expected outcomes have been explained.  We discussed the additional risks associated with surgery during pregnancy. All of the patient's questions at this time have been answered.  The likelihood of reaching the patient's treatment goal is good.  The patient understand the proposed surgical procedure and wishes to proceed.  Wilmon Arms. Corliss Skains, MD, [facility]  General/ Trauma Surgery  09/08/2014 9:50 AM

## 2014-09-09 ENCOUNTER — Encounter (HOSPITAL_COMMUNITY): Payer: Self-pay | Admitting: Anesthesiology

## 2014-09-09 ENCOUNTER — Encounter (HOSPITAL_COMMUNITY): Payer: 59 | Admitting: Anesthesiology

## 2014-09-09 ENCOUNTER — Inpatient Hospital Stay (HOSPITAL_COMMUNITY): Payer: 59 | Admitting: Anesthesiology

## 2014-09-09 ENCOUNTER — Inpatient Hospital Stay (HOSPITAL_COMMUNITY): Payer: 59

## 2014-09-09 ENCOUNTER — Encounter (HOSPITAL_COMMUNITY)
Admission: EM | Disposition: A | Discharge status: Home / Self Care | Payer: Self-pay | Source: Home / Self Care | Attending: Internal Medicine

## 2014-09-09 HISTORY — PX: CHOLECYSTECTOMY: SHX55

## 2014-09-09 LAB — CBC
HEMATOCRIT: 27.6 % — AB (ref 36.0–46.0)
HEMOGLOBIN: 10.1 g/dL — AB (ref 12.0–15.0)
MCH: 30.8 pg (ref 26.0–34.0)
MCHC: 36.6 g/dL — AB (ref 30.0–36.0)
MCV: 84.1 fL (ref 78.0–100.0)
Platelets: 174 10*3/uL (ref 150–400)
RBC: 3.28 MIL/uL — AB (ref 3.87–5.11)
RDW: 13.9 % (ref 11.5–15.5)
WBC: 4.9 10*3/uL (ref 4.0–10.5)

## 2014-09-09 LAB — COMPREHENSIVE METABOLIC PANEL
ALK PHOS: 43 U/L (ref 39–117)
ALT: 107 U/L — AB (ref 0–35)
ANION GAP: 9 (ref 5–15)
AST: 24 U/L (ref 0–37)
Albumin: 2.3 g/dL — ABNORMAL LOW (ref 3.5–5.2)
CO2: 22 meq/L (ref 19–32)
Calcium: 8.3 mg/dL — ABNORMAL LOW (ref 8.4–10.5)
Chloride: 106 mEq/L (ref 96–112)
Creatinine, Ser: 0.43 mg/dL — ABNORMAL LOW (ref 0.50–1.10)
GLUCOSE: 84 mg/dL (ref 70–99)
Potassium: 3.3 mEq/L — ABNORMAL LOW (ref 3.7–5.3)
SODIUM: 137 meq/L (ref 137–147)
TOTAL PROTEIN: 5.3 g/dL — AB (ref 6.0–8.3)
Total Bilirubin: 0.8 mg/dL (ref 0.3–1.2)

## 2014-09-09 LAB — SURGICAL PCR SCREEN
MRSA, PCR: NEGATIVE
Staphylococcus aureus: NEGATIVE

## 2014-09-09 LAB — LIPASE, BLOOD: Lipase: 238 U/L — ABNORMAL HIGH (ref 11–59)

## 2014-09-09 SURGERY — LAPAROSCOPIC CHOLECYSTECTOMY WITH INTRAOPERATIVE CHOLANGIOGRAM
Anesthesia: General | Site: Abdomen

## 2014-09-09 MED ORDER — PROPOFOL 10 MG/ML IV BOLUS
INTRAVENOUS | Status: AC
  Filled 2014-09-09: qty 20

## 2014-09-09 MED ORDER — GLYCOPYRROLATE 0.2 MG/ML IJ SOLN
INTRAMUSCULAR | Status: DC | PRN
  Administered 2014-09-09: 0.6 mg via INTRAVENOUS

## 2014-09-09 MED ORDER — EPHEDRINE SULFATE 50 MG/ML IJ SOLN
INTRAMUSCULAR | Status: DC | PRN
  Administered 2014-09-09: 5 mg via INTRAVENOUS

## 2014-09-09 MED ORDER — MORPHINE SULFATE 2 MG/ML IJ SOLN
2.0000 mg | INTRAMUSCULAR | Status: DC | PRN
  Administered 2014-09-09: 4 mg via INTRAVENOUS
  Filled 2014-09-09: qty 2

## 2014-09-09 MED ORDER — HYDROCODONE-ACETAMINOPHEN 5-325 MG PO TABS
ORAL_TABLET | ORAL | Status: AC
  Administered 2014-09-09: 2 via ORAL
  Filled 2014-09-09: qty 2

## 2014-09-09 MED ORDER — LIDOCAINE HCL (CARDIAC) 20 MG/ML IV SOLN
INTRAVENOUS | Status: DC | PRN
  Administered 2014-09-09: 50 mg via INTRAVENOUS

## 2014-09-09 MED ORDER — CEFAZOLIN SODIUM-DEXTROSE 2-3 GM-% IV SOLR
INTRAVENOUS | Status: DC | PRN
  Administered 2014-09-09: 2 g via INTRAVENOUS

## 2014-09-09 MED ORDER — SUCCINYLCHOLINE CHLORIDE 20 MG/ML IJ SOLN
INTRAMUSCULAR | Status: DC | PRN
  Administered 2014-09-09: 45 mg via INTRAVENOUS

## 2014-09-09 MED ORDER — SODIUM CHLORIDE 0.9 % IR SOLN
Status: DC | PRN
  Administered 2014-09-09: 1000 mL

## 2014-09-09 MED ORDER — NEOSTIGMINE METHYLSULFATE 10 MG/10ML IV SOLN
INTRAVENOUS | Status: DC | PRN
  Administered 2014-09-09: 3 mg via INTRAVENOUS

## 2014-09-09 MED ORDER — POTASSIUM CHLORIDE CRYS ER 20 MEQ PO TBCR
40.0000 meq | EXTENDED_RELEASE_TABLET | Freq: Every day | ORAL | Status: DC
  Administered 2014-09-09 – 2014-09-10 (×2): 40 meq via ORAL
  Filled 2014-09-09 (×2): qty 2

## 2014-09-09 MED ORDER — EPHEDRINE SULFATE 50 MG/ML IJ SOLN
INTRAMUSCULAR | Status: AC
  Filled 2014-09-09: qty 1

## 2014-09-09 MED ORDER — PROPOFOL 10 MG/ML IV BOLUS
INTRAVENOUS | Status: DC | PRN
  Administered 2014-09-09: 30 mg via INTRAVENOUS
  Administered 2014-09-09: 120 mg via INTRAVENOUS

## 2014-09-09 MED ORDER — ROCURONIUM BROMIDE 50 MG/5ML IV SOLN
INTRAVENOUS | Status: AC
  Filled 2014-09-09: qty 1

## 2014-09-09 MED ORDER — 0.9 % SODIUM CHLORIDE (POUR BTL) OPTIME
TOPICAL | Status: DC | PRN
  Administered 2014-09-09: 1000 mL

## 2014-09-09 MED ORDER — SUCCINYLCHOLINE CHLORIDE 20 MG/ML IJ SOLN
INTRAMUSCULAR | Status: AC
  Filled 2014-09-09: qty 1

## 2014-09-09 MED ORDER — ONDANSETRON HCL 4 MG/2ML IJ SOLN
INTRAMUSCULAR | Status: AC
  Filled 2014-09-09: qty 2

## 2014-09-09 MED ORDER — LACTATED RINGERS IV SOLN
INTRAVENOUS | Status: DC | PRN
  Administered 2014-09-09 (×2): via INTRAVENOUS

## 2014-09-09 MED ORDER — LIDOCAINE HCL (CARDIAC) 20 MG/ML IV SOLN
INTRAVENOUS | Status: AC
  Filled 2014-09-09: qty 5

## 2014-09-09 MED ORDER — BUPIVACAINE-EPINEPHRINE 0.25% -1:200000 IJ SOLN
INTRAMUSCULAR | Status: DC | PRN
  Administered 2014-09-09: 12 mL

## 2014-09-09 MED ORDER — NEOSTIGMINE METHYLSULFATE 10 MG/10ML IV SOLN
INTRAVENOUS | Status: AC
  Filled 2014-09-09: qty 1

## 2014-09-09 MED ORDER — ROCURONIUM BROMIDE 100 MG/10ML IV SOLN
INTRAVENOUS | Status: DC | PRN
  Administered 2014-09-09: 25 mg via INTRAVENOUS

## 2014-09-09 MED ORDER — ONDANSETRON HCL 4 MG/2ML IJ SOLN: 4.0000 mg | Freq: Once | INTRAMUSCULAR | Status: DC | PRN

## 2014-09-09 MED ORDER — HYDROMORPHONE HCL 1 MG/ML IJ SOLN
INTRAMUSCULAR | Status: AC
  Filled 2014-09-09: qty 1

## 2014-09-09 MED ORDER — MORPHINE SULFATE 4 MG/ML IJ SOLN
INTRAMUSCULAR | Status: AC
  Administered 2014-09-09: 4 mg
  Filled 2014-09-09: qty 1

## 2014-09-09 MED ORDER — BUPIVACAINE-EPINEPHRINE (PF) 0.25% -1:200000 IJ SOLN
INTRAMUSCULAR | Status: AC
  Filled 2014-09-09: qty 30

## 2014-09-09 MED ORDER — HYDROMORPHONE HCL 1 MG/ML IJ SOLN
0.2500 mg | INTRAMUSCULAR | Status: DC | PRN
  Administered 2014-09-09 (×2): 0.5 mg via INTRAVENOUS

## 2014-09-09 MED ORDER — HYDROCODONE-ACETAMINOPHEN 5-325 MG PO TABS
1.0000 | ORAL_TABLET | ORAL | Status: DC | PRN
  Administered 2014-09-09 – 2014-09-10 (×3): 2 via ORAL
  Filled 2014-09-09 (×2): qty 2

## 2014-09-09 MED ORDER — STERILE WATER FOR INJECTION IJ SOLN
INTRAMUSCULAR | Status: AC
  Filled 2014-09-09: qty 10

## 2014-09-09 MED ORDER — ONDANSETRON HCL 4 MG/2ML IJ SOLN
INTRAMUSCULAR | Status: DC | PRN
  Administered 2014-09-09: 4 mg via INTRAVENOUS

## 2014-09-09 MED ORDER — GLYCOPYRROLATE 0.2 MG/ML IJ SOLN
INTRAMUSCULAR | Status: AC
  Filled 2014-09-09: qty 3

## 2014-09-09 MED ORDER — LACTATED RINGERS IV SOLN
INTRAVENOUS | Status: DC
  Administered 2014-09-09: 08:00:00 via INTRAVENOUS

## 2014-09-09 MED ORDER — FENTANYL CITRATE 0.05 MG/ML IJ SOLN
INTRAMUSCULAR | Status: AC
  Filled 2014-09-09: qty 5

## 2014-09-09 MED ORDER — ARTIFICIAL TEARS OP OINT
TOPICAL_OINTMENT | OPHTHALMIC | Status: AC
  Filled 2014-09-09: qty 3.5

## 2014-09-09 MED ORDER — IOHEXOL 300 MG/ML  SOLN
INTRAMUSCULAR | Status: DC | PRN
  Administered 2014-09-09: 10:00:00

## 2014-09-09 MED ORDER — FENTANYL CITRATE 0.05 MG/ML IJ SOLN
INTRAMUSCULAR | Status: DC | PRN
  Administered 2014-09-09: 200 ug via INTRAVENOUS
  Administered 2014-09-09: 50 ug via INTRAVENOUS

## 2014-09-09 SURGICAL SUPPLY — 47 items
APPLIER CLIP ROT 10 11.4 M/L (STAPLE) ×3
BENZOIN TINCTURE PRP APPL 2/3 (GAUZE/BANDAGES/DRESSINGS) ×3 IMPLANT
BLADE SURG ROTATE 9660 (MISCELLANEOUS) IMPLANT
CANISTER SUCTION 2500CC (MISCELLANEOUS) ×3 IMPLANT
CHLORAPREP W/TINT 26ML (MISCELLANEOUS) ×3 IMPLANT
CLIP APPLIE ROT 10 11.4 M/L (STAPLE) ×1 IMPLANT
CLOSURE WOUND 1/2 X4 (GAUZE/BANDAGES/DRESSINGS) ×1
COVER MAYO STAND STRL (DRAPES) ×3 IMPLANT
COVER SURGICAL LIGHT HANDLE (MISCELLANEOUS) ×3 IMPLANT
DRAPE C-ARM 42X72 X-RAY (DRAPES) ×3 IMPLANT
DRAPE UTILITY 15X26 W/TAPE STR (DRAPE) ×6 IMPLANT
DRSG TEGADERM 2-3/8X2-3/4 SM (GAUZE/BANDAGES/DRESSINGS) ×6 IMPLANT
DRSG TEGADERM 4X4.75 (GAUZE/BANDAGES/DRESSINGS) ×3 IMPLANT
ELECT REM PT RETURN 9FT ADLT (ELECTROSURGICAL) ×3
ELECTRODE REM PT RTRN 9FT ADLT (ELECTROSURGICAL) ×1 IMPLANT
FILTER SMOKE EVAC LAPAROSHD (FILTER) ×3 IMPLANT
GAUZE SPONGE 2X2 8PLY STRL LF (GAUZE/BANDAGES/DRESSINGS) IMPLANT
GLOVE BIO SURGEON STRL SZ 6.5 (GLOVE) ×2 IMPLANT
GLOVE BIO SURGEON STRL SZ7 (GLOVE) ×3 IMPLANT
GLOVE BIO SURGEON STRL SZ7.5 (GLOVE) ×3 IMPLANT
GLOVE BIO SURGEONS STRL SZ 6.5 (GLOVE) ×1
GLOVE BIOGEL PI IND STRL 7.0 (GLOVE) ×3 IMPLANT
GLOVE BIOGEL PI IND STRL 7.5 (GLOVE) ×2 IMPLANT
GLOVE BIOGEL PI INDICATOR 7.0 (GLOVE) ×6
GLOVE BIOGEL PI INDICATOR 7.5 (GLOVE) ×4
GOWN STRL REUS W/ TWL LRG LVL3 (GOWN DISPOSABLE) ×3 IMPLANT
GOWN STRL REUS W/TWL LRG LVL3 (GOWN DISPOSABLE) ×6
KIT BASIN OR (CUSTOM PROCEDURE TRAY) ×3 IMPLANT
KIT ROOM TURNOVER OR (KITS) ×3 IMPLANT
NS IRRIG 1000ML POUR BTL (IV SOLUTION) ×3 IMPLANT
PAD ARMBOARD 7.5X6 YLW CONV (MISCELLANEOUS) ×3 IMPLANT
POUCH SPECIMEN RETRIEVAL 10MM (ENDOMECHANICALS) ×3 IMPLANT
SCISSORS LAP 5X35 DISP (ENDOMECHANICALS) ×3 IMPLANT
SET CHOLANGIOGRAPH 5 50 .035 (SET/KITS/TRAYS/PACK) ×3 IMPLANT
SET IRRIG TUBING LAPAROSCOPIC (IRRIGATION / IRRIGATOR) ×3 IMPLANT
SLEEVE ENDOPATH XCEL 5M (ENDOMECHANICALS) ×3 IMPLANT
SPECIMEN JAR SMALL (MISCELLANEOUS) ×3 IMPLANT
SPONGE GAUZE 2X2 STER 10/PKG (GAUZE/BANDAGES/DRESSINGS)
STRIP CLOSURE SKIN 1/2X4 (GAUZE/BANDAGES/DRESSINGS) ×2 IMPLANT
SUT MNCRL AB 4-0 PS2 18 (SUTURE) ×3 IMPLANT
TOWEL OR 17X24 6PK STRL BLUE (TOWEL DISPOSABLE) IMPLANT
TOWEL OR 17X26 10 PK STRL BLUE (TOWEL DISPOSABLE) ×3 IMPLANT
TRAY LAPAROSCOPIC (CUSTOM PROCEDURE TRAY) ×3 IMPLANT
TROCAR XCEL BLUNT TIP 100MML (ENDOMECHANICALS) ×3 IMPLANT
TROCAR XCEL NON-BLD 11X100MML (ENDOMECHANICALS) ×3 IMPLANT
TROCAR XCEL NON-BLD 5MMX100MML (ENDOMECHANICALS) ×3 IMPLANT
TUBING INSUFF HIGH FLOW RTP (TUBING) ×3 IMPLANT

## 2014-09-09 NOTE — Discharge Instructions (Signed)
Laparoscopic Cholecystectomy, Care After °Refer to this sheet in the next few weeks. These instructions provide you with information on caring for yourself after your procedure. Your health care provider may also give you more specific instructions. Your treatment has been planned according to current medical practices, but problems sometimes occur. Call your health care provider if you have any problems or questions after your procedure. °WHAT TO EXPECT AFTER THE PROCEDURE °After your procedure, it is typical to have the following: °· Pain at your incision sites. You will be given pain medicines to control the pain. °· Mild nausea or vomiting. This should improve after the first 24 hours. °· Bloating and possibly shoulder pain from the gas used during the procedure. This will improve after the first 24 hours. °HOME CARE INSTRUCTIONS  °· Change bandages (dressings) as directed by your health care provider. °· Keep the wound dry and clean. You may wash the wound gently with soap and water. Gently blot or dab the area dry. °· Do not take baths or use swimming pools or hot tubs for 2 weeks or until your health care provider approves. °· Only take over-the-counter or prescription medicines as directed by your health care provider. °· Continue your normal diet as directed by your health care provider. °· Do not lift anything heavier than 10 pounds (4.5 kg) until your health care provider approves. °· Do not play contact sports for 1 week or until your health care provider approves. °SEEK MEDICAL CARE IF:  °· You have redness, swelling, or increasing pain in the wound. °· You notice yellowish-white fluid (pus) coming from the wound. °· You have drainage from the wound that lasts longer than 1 day. °· You notice a bad smell coming from the wound or dressing. °· Your surgical cuts (incisions) break open. °SEEK IMMEDIATE MEDICAL CARE IF:  °· You develop a rash. °· You have difficulty breathing. °· You have chest pain. °· You  have a fever. °· You have increasing pain in the shoulders (shoulder strap areas). °· You have dizzy episodes or faint while standing. °· You have severe abdominal pain. °· You feel sick to your stomach (nauseous) or throw up (vomit) and this lasts for more than 1 day. °Document Released: 12/02/2005 Document Revised: 09/22/2013 Document Reviewed: 07/14/2013 °ExitCare® Patient Information ©2015 [employer]. This information is not intended to replace advice given to you by your health care provider. Make sure you discuss any questions you have with your health care provider. ° °CCS ______[facility] °LAPAROSCOPIC SURGERY: POST OP INSTRUCTIONS °Always review your discharge instruction sheet given to you by the facility where your surgery was performed. °IF YOU HAVE DISABILITY OR FAMILY LEAVE FORMS, YOU MUST BRING THEM TO THE OFFICE FOR PROCESSING.   °DO NOT GIVE THEM TO YOUR DOCTOR. ° °1. A prescription for pain medication may be given to you upon discharge.  Take your pain medication as prescribed, if needed.  If narcotic pain medicine is not needed, then you may take acetaminophen (Tylenol) or ibuprofen (Advil) as needed. °2. Take your usually prescribed medications unless otherwise directed. °3. If you need a refill on your pain medication, please contact your pharmacy.  They will contact our office to request authorization. Prescriptions will not be filled after 5pm or on week-ends. °4. You should follow a light diet the first few days after arrival home, such as soup and crackers, etc.  Be sure to include lots of fluids daily. °5. Most patients will experience some   swelling and bruising in the area of the incisions.  Ice packs will help.  Swelling and bruising can take several days to resolve.  °6. It is common to experience some constipation if taking pain medication after surgery.  Increasing fluid intake and taking a stool softener (such as Colace) will usually help or prevent this problem  from occurring.  A mild laxative (Milk of Magnesia or Miralax) should be taken according to package instructions if there are no bowel movements after 48 hours. °7. Unless discharge instructions indicate otherwise, you may remove your bandages 24-48 hours after surgery, and you may shower at that time.  You may have steri-strips (small skin tapes) in place directly over the incision.  These strips should be left on the skin for 7-10 days.  If your surgeon used skin glue on the incision, you may shower in 24 hours.  The glue will flake off over the next 2-3 weeks.  Any sutures or staples will be removed at the office during your follow-up visit. °8. ACTIVITIES:  You may resume regular (light) daily activities beginning the next day--such as daily self-care, walking, climbing stairs--gradually increasing activities as tolerated.  You may have sexual intercourse when it is comfortable.  Refrain from any heavy lifting or straining until approved by your doctor. °a. You may drive when you are no longer taking prescription pain medication, you can comfortably wear a seatbelt, and you can safely maneuver your car and apply brakes. °b. RETURN TO WORK:  __________________________________________________________ °9. You should see your doctor in the office for a follow-up appointment approximately 2-3 weeks after your surgery.  Make sure that you call for this appointment within a day or two after you arrive home to insure a convenient appointment time. °10. OTHER INSTRUCTIONS: __________________________________________________________________________________________________________________________ __________________________________________________________________________________________________________________________ °WHEN TO CALL YOUR DOCTOR: °1. Fever over 101.0 °2. Inability to urinate °3. Continued bleeding from incision. °4. Increased pain, redness, or drainage from the incision. °5. Increasing abdominal pain ° °The  clinic staff is available to answer your questions during regular business hours.  Please don’t hesitate to call and ask to speak to one of the nurses for clinical concerns.  If you have a medical emergency, go to the nearest emergency room or call 911.  A surgeon from [facility] is always on call at the hospital. °[address], [address], [place], [place]  [address] ? [address] [address], [place], [place]   [address] °[phone] ? [phone] ? FAX [phone] °Web site: [email or web address] °

## 2014-09-09 NOTE — Progress Notes (Signed)
Patient ID: Toni Romero  female  ZOX:[id number]    DOB: 28-Mar-1991    DOA: 09/04/2014  PCP: No PCP Per Patient  Assessment/Plan: Principal Problem: Nausea and vomiting during pregnancy worsened likely due to acute pancreatitis, choledocholithiasis with obstruction - Continue clears, GI, surgery following. MRCP negative for CBD stone or CBD dilatation - Patient underwent left cholecystectomy today - Continue IV antiemetics and fluids, clears for surgery   Elevated liver function tests and lipase  - Due to #1, trending down  Anemia: Possibly worsened due to hemodilution - transfused 2 units packed RBCs, hemoglobin stable - Started on prenatal vitamin and iron supplement  Urinary tract infection  - Continue IV Rocephin, urine culture and sensitivities showed 75,000 colonies given her pregnancy, will continue IV Rocephin for total of 7 days  Second trimester Pregnancy  - Consider to monitor. Depending on patient's clinical course may contact her OB physician Dr. Jolayne Panther for further evaluation.   Hypokalemia  - Replaced  DVT Prophylaxis: Lovenox  Code Status: Full code  Family Communication: Discussed with patient, her mother, fiance prior to the surgery today  Disposition:  Consultants:  Surgery  Gastroenterology  Procedures:  None  Antibiotics:  IV Rocephin    Subjective: Patient seen and examined prior to the surgery, somewhat nervous otherwise stable medically  Objective: Weight change:   Intake/Output Summary (Last 24 hours) at 09/09/14 1259 Last data filed at 09/09/14 1100  Gross per 24 hour  Intake 3723.33 ml  Output     25 ml  Net 3698.33 ml   Blood pressure 118/71, pulse 92, temperature 97.9 F (36.6 C), temperature source Oral, resp. rate 17, height  (1.626 m), weight 56.645 kg (124 lb 14.1 oz), last menstrual period 06/07/2014, SpO2 100.00%, unknown if currently breastfeeding.  Physical Exam: General: Alert and awake, oriented x3,  NAD CVS: S1-S2 clear Chest: CTAB Abdomen: soft normal bowel sounds Extremities: no cyanosis, clubbing or edema noted bilaterally   Lab Results: Basic Metabolic Panel:  Recent Labs Lab 09/06/14 0406  09/08/14 0540 09/09/14 0611  NA 137  < > 138 137  K 3.2*  < > 3.5* 3.3*  CL 103  < > 106 106  CO2 20  < > 21 22  GLUCOSE 62*  < > 84 84  BUN 9  < > <3* <3*  CREATININE 0.57  < > 0.45* 0.43*  CALCIUM 8.5  < > 8.3* 8.3*  MG 1.7  --   --   --   < > = values in this interval not displayed. Liver Function Tests:  Recent Labs Lab 09/08/14 0540 09/09/14 0611  AST 34 24  ALT 136* 107*  ALKPHOS 45 43  BILITOT 1.1 0.8  PROT 5.3* 5.3*  ALBUMIN 2.3* 2.3*    Recent Labs Lab 09/08/14 0540 09/09/14 0611  LIPASE 442* 238*   No results found for this basename: AMMONIA,  in the last 168 hours CBC:  Recent Labs Lab 09/04/14 2129  09/08/14 0540 09/09/14 0611  WBC 6.7  < > 5.3 4.9  NEUTROABS 4.7  --   --   --   HGB 13.8  < > 10.1* 10.1*  HCT 37.1  < > 27.9* 27.6*  MCV 87.3  < > 86.1 84.1  PLT 201  < > 151 174  < > = values in this interval not displayed. Cardiac Enzymes: No results found for this basename: CKTOTAL, CKMB, CKMBINDEX, TROPONINI,  in the last 168 hours BNP: No components found with  this basename: POCBNP,  CBG: No results found for this basename: GLUCAP,  in the last 168 hours   Micro Results: Recent Results (from the past 240 hour(s))  URINE CULTURE     Status: None   Collection Time    09/05/14  2:22 AM      Result Value Ref Range Status   Specimen Description URINE, RANDOM   Final   Special Requests Normal   Final   Culture  Setup Time     Final   Value: 09/05/2014 10:18     Performed at [facility] Count     Final   Value: 75,000 COLONIES/ML     Performed at [facility]   Culture     Final   Value: Multiple bacterial morphotypes present, none predominant. Suggest appropriate recollection if clinically indicated.      Performed at [facility]   Report Status 09/06/2014 FINAL   Final  SURGICAL PCR SCREEN     Status: None   Collection Time    09/09/14  5:36 AM      Result Value Ref Range Status   MRSA, PCR NEGATIVE  NEGATIVE Final   Staphylococcus aureus NEGATIVE  NEGATIVE Final   Comment:            The Xpert SA Assay (FDA     approved for NASAL specimens     in patients over 53 years of age),     is one component of     a comprehensive surveillance     program.  Test performance has     been validated by [employer] for patients greater     than or equal to 33 year old.     It is not intended     to diagnose infection nor to     guide or monitor treatment.    Studies/Results: US Abdomen Complete  09/05/2014   CLINICAL DATA:  Elevated lipase and T bili, emesis during pregnancy.  EXAM: ULTRASOUND ABDOMEN COMPLETE  COMPARISON:  None.  FINDINGS: Gallbladder:  Multiple punctate echogenic foci central within the gallbladder. No gallbladder wall thickening or pericholecystic fluid. No sonographic Murphy's sign elicited.  Common bile duct:  Diameter: 2 mm  Liver:  No focal lesion identified. Within normal limits in parenchymal echogenicity.  IVC:  No abnormality visualized.  Pancreas:  Visualized portion unremarkable.  Spleen:  Size and appearance within normal limits.  Right Kidney:  Length: 10.4 cm. Echogenicity within normal limits. No mass or hydronephrosis visualized.  Left Kidney:  Length: 10.1 cm. Echogenicity within normal limits. No mass or hydronephrosis visualized.  Abdominal aorta:  No aneurysm visualized.  Other findings:  None.  IMPRESSION: Gallbladder sludge without sonographic findings of acute cholecystitis.   Electronically Signed   By: Awilda Metro   On: 09/05/2014 00:42   Mr Abdomen Mrcp Wo Cm  09/05/2014   CLINICAL DATA:  Pregnant patient with nausea, vomiting and right-sided chest pain. Shortness of breath. Right upper quadrant abdominal pain. Elevated liver function  tests and elevated lipase.  EXAM: MRI ABDOMEN WITHOUT CONTRAST  (INCLUDING MRCP)  TECHNIQUE: Multiplanar multisequence MR imaging of the abdomen was performed. Heavily T2-weighted images of the biliary and pancreatic ducts were obtained, and three-dimensional MRCP images were rendered by post processing.  COMPARISON:  None.  FINDINGS: No focal hepatic lesions are noted. There is diffuse increased T2 signal intensity adjacent to the portal triads, compatible with periportal edema.  MRCP images demonstrate no intra or extrahepatic biliary ductal dilatation. The common bile duct is normal in caliber measuring 3 mm in the porta hepatis. Pancreatic duct is not dilated. The gallbladder does not appear distended. No filling defects within the gallbladder or within the common bile duct to suggest presence of cholelithiasis or choledocholithiasis.  There is a small amount of increased T2 signal intensity in the retroperitoneum adjacent to the pancreas. No well-defined peripancreatic fluid collection is noted. The appearance of the spleen, bilateral adrenal glands and bilateral kidneys is unremarkable on today's non contrast MRI examination.  IMPRESSION: 1. No intra or extrahepatic biliary ductal dilatation. 2. Periportal edema in the liver. 3. Small amount of increased T2 signal intensity in the retroperitoneum adjacent to the pancreas, presumably secondary to inflammatory changes given the patient's elevated lipase.   Electronically Signed   By: Trudie Reed M.D.   On: 09/05/2014 19:01   Mr 3d Recon At Scanner  09/05/2014   CLINICAL DATA:  Pregnant patient with nausea, vomiting and right-sided chest pain. Shortness of breath. Right upper quadrant abdominal pain. Elevated liver function tests and elevated lipase.  EXAM: MRI ABDOMEN WITHOUT CONTRAST  (INCLUDING MRCP)  TECHNIQUE: Multiplanar multisequence MR imaging of the abdomen was performed. Heavily T2-weighted images of the biliary and pancreatic ducts were  obtained, and three-dimensional MRCP images were rendered by post processing.  COMPARISON:  None.  FINDINGS: No focal hepatic lesions are noted. There is diffuse increased T2 signal intensity adjacent to the portal triads, compatible with periportal edema. MRCP images demonstrate no intra or extrahepatic biliary ductal dilatation. The common bile duct is normal in caliber measuring 3 mm in the porta hepatis. Pancreatic duct is not dilated. The gallbladder does not appear distended. No filling defects within the gallbladder or within the common bile duct to suggest presence of cholelithiasis or choledocholithiasis.  There is a small amount of increased T2 signal intensity in the retroperitoneum adjacent to the pancreas. No well-defined peripancreatic fluid collection is noted. The appearance of the spleen, bilateral adrenal glands and bilateral kidneys is unremarkable on today's non contrast MRI examination.  IMPRESSION: 1. No intra or extrahepatic biliary ductal dilatation. 2. Periportal edema in the liver. 3. Small amount of increased T2 signal intensity in the retroperitoneum adjacent to the pancreas, presumably secondary to inflammatory changes given the patient's elevated lipase.   Electronically Signed   By: Trudie Reed M.D.   On: 09/05/2014 19:01   [place] Mfm Fetal Nuchal Translucency  09/02/2014   OBSTETRICAL ULTRASOUND: This exam was performed within a [facility]. The OB US report was generated in the [facility] system, and faxed to the ordering physician.   This report is available in the [employer]. See the [facility] Obstetric US report via the Image Link.   Medications: Scheduled Meds: . cefTRIAXone (ROCEPHIN)  IV  1 g Intravenous QHS  . feeding supplement (RESOURCE BREEZE)  1 Container Oral TID BM  . ferrous gluconate  324 mg Oral BID WC  . magnesium oxide  400 mg Oral BID  . pantoprazole (PROTONIX) IV  20 mg Intravenous BID  . prenatal multivitamin  1 tablet Oral Q1200       LOS: 5 days   Tanna Loeffler M.D. [facility] 09/09/2014, 12:59 PM Pager: [phone]  If 7PM-7AM, please contact night-coverage [email or web address] Password [email or web address]

## 2014-09-09 NOTE — Op Note (Signed)
Laparoscopic Cholecystectomy with IOC Procedure Note  Indications: This patient presents with symptomatic gallbladder disease and will undergo laparoscopic cholecystectomy.  Pre-operative Diagnosis: Biliary pancreatitis   Post-operative Diagnosis: Same  Surgeon: Aubrianna Orchard K.   Assistants: none  Anesthesia: General endotracheal anesthesia  ASA Class: 2  Procedure Details  The patient was seen again in the Holding Room. The risks, benefits, complications, treatment options, and expected outcomes were discussed with the patient. The possibilities of reaction to medication, pulmonary aspiration, perforation of viscus, bleeding, recurrent infection, finding a normal gallbladder, the need for additional procedures, failure to diagnose a condition, the possible need to convert to an open procedure, and creating a complication requiring transfusion or operation were discussed with the patient. The likelihood of improving the patient's symptoms with return to their baseline status is good.  The patient and/or family concurred with the proposed plan, giving informed consent. The site of surgery properly noted. The patient is [redacted] weeks pregnant and fetal heart tones were documented prior to surgery.  The patient was taken to Operating Room, identified as Toni Romero and the procedure verified as Laparoscopic Cholecystectomy with Intraoperative Cholangiogram. A Time Out was held and the above information confirmed.  Prior to the induction of general anesthesia, antibiotic prophylaxis was administered. General endotracheal anesthesia was then administered and tolerated well. After the induction, the abdomen was prepped with Chloraprep and draped in the sterile fashion. The patient was positioned in the supine position.  A lead apron was wrapped around her lower torso over the uterus.    Local anesthetic agent was injected into the skin near the umbilicus and an incision made. We dissected down to the  abdominal fascia with blunt dissection.  The fascia was incised vertically and we entered the peritoneal cavity bluntly.  A pursestring suture of 0-Vicryl was placed around the fascial opening.  The Hasson cannula was inserted and secured with the stay suture.  Pneumoperitoneum was then created with CO2 and tolerated well without any adverse changes in the patient's vital signs. An 11-mm port was placed in the subxiphoid position.  Two 5-mm ports were placed in the right upper quadrant. All skin incisions were infiltrated with a local anesthetic agent before making the incision and placing the trocars.   We positioned the patient in reverse Trendelenburg, tilted slightly to the patient's left.  The gallbladder was identified, the fundus grasped and retracted cephalad.  There were minimal adhesions to the gallbladder.  There was some bilious appearing fluid in the pelvis and around the liver.  Adhesions were lysed bluntly and with the electrocautery where indicated, taking care not to injure any adjacent organs or viscus. The infundibulum was grasped and retracted laterally, exposing the peritoneum overlying the triangle of Calot. This was then divided and exposed in a blunt fashion. A critical view of the cystic duct and cystic artery was obtained.  The cystic artery was identified, dissected free, ligated with clips and divided, since it was anterior to the cystic duct.  The cystic duct was clearly identified and bluntly dissected circumferentially. The cystic duct was ligated with a clip distally.   An incision was made in the cystic duct and the [facility] cholangiogram catheter introduced. The catheter was secured using a clip. A cholangiogram was then obtained which showed good visualization of the distal and proximal biliary tree with no sign of filling defects or obstruction.  Contrast flowed easily into the duodenum. The catheter was then removed.  Only 4 seconds of fluoroscopy were used.  The cystic duct was  then ligated with clips and divided.  The gallbladder was dissected from the liver bed in retrograde fashion with the electrocautery. The gallbladder was removed and placed in an Endocatch sac. The liver bed was irrigated and inspected. Hemostasis was achieved with the electrocautery. Copious irrigation was utilized and was repeatedly aspirated until clear.  The gallbladder and Endocatch sac were then removed through the umbilical port site.  The pursestring suture was used to close the umbilical fascia.    We again inspected the right upper quadrant for hemostasis.  Pneumoperitoneum was released as we removed the trocars.  4-0 Monocryl was used to close the skin.   Benzoin, steri-strips, and clean dressings were applied. The patient was then extubated and brought to the recovery room in stable condition. Instrument, sponge, and needle counts were correct at closure and at the conclusion of the case.   Findings: Mild cholecystitis with Cholelithiasis  Estimated Blood Loss: Minimal         Drains: none         Specimens: Gallbladder           Complications: None; patient tolerated the procedure well.         Disposition: PACU - hemodynamically stable.         Condition: stable  Wilmon Arms. Corliss Skains, MD, [facility] Surgery  General/ Trauma Surgery  09/09/2014 10:08 AM

## 2014-09-09 NOTE — Transfer of Care (Signed)
Immediate Anesthesia Transfer of Care Note  Patient: Toni Romero  Procedure(s) Performed: Procedure(s): LAPAROSCOPIC CHOLECYSTECTOMY WITH INTRAOPERATIVE CHOLANGIOGRAM (N/A)  Patient Location: PACU  Anesthesia Type:General  Level of Consciousness: sedated  Airway & Oxygen Therapy: Patient Spontanous Breathing and Patient connected to face mask oxygen  Post-op Assessment: Report given to PACU RN and Post -op Vital signs reviewed and stable  Post vital signs: Reviewed and stable  Complications: No apparent anesthesia complications

## 2014-09-09 NOTE — Anesthesia Postprocedure Evaluation (Signed)
  Anesthesia Post-op Note  Patient: Toni Romero  Procedure(s) Performed: Procedure(s): LAPAROSCOPIC CHOLECYSTECTOMY WITH INTRAOPERATIVE CHOLANGIOGRAM (N/A)  Patient Location: PACU  Anesthesia Type:General  Level of Consciousness: awake, alert , oriented and patient cooperative  Airway and Oxygen Therapy: Patient Spontanous Breathing  Post-op Pain: mild  Post-op Assessment: Post-op Vital signs reviewed, Patient's Cardiovascular Status Stable, Respiratory Function Stable, Patent Airway, No signs of Nausea or vomiting and Pain level controlled  Post-op Vital Signs: stable  Last Vitals:  Filed Vitals:   09/09/14 1104  BP:   Pulse: 85  Temp:   Resp: 17    Complications: No apparent anesthesia complications

## 2014-09-09 NOTE — Progress Notes (Signed)
Called to see pt post-op to check FH. Pt [redacted]w[redacted]d gestation. FH in lower abd at 140bpm via hand held doppler. No complaints of uterine cramping or vag discharge. Pt has regularly scheduled OB appointment next week. Pt had no comments or concerns.

## 2014-09-09 NOTE — Progress Notes (Signed)
Unable to doppler fetal heart tones.

## 2014-09-09 NOTE — Anesthesia Preprocedure Evaluation (Addendum)
Anesthesia Evaluation  Patient identified by MRN, date of birth, ID band Patient awake    Reviewed: Allergy & Precautions, H&P , NPO status , Patient's Chart, lab work & pertinent test results  Airway       Dental   Pulmonary          Cardiovascular     Neuro/Psych    GI/Hepatic   Endo/Other    Renal/GU      Musculoskeletal   Abdominal   Peds  Hematology  (+) anemia ,   Anesthesia Other Findings 13 wk pregnancy  Reproductive/Obstetrics (+) Pregnancy                          Anesthesia Physical Anesthesia Plan  ASA: II  Anesthesia Plan: General   Post-op Pain Management:    Induction: Intravenous and Cricoid pressure planned  Airway Management Planned: Oral ETT  Additional Equipment:   Intra-op Plan:   Post-operative Plan: Extubation in OR  Informed Consent: I have reviewed the patients History and Physical, chart, labs and discussed the procedure including the risks, benefits and alternatives for the proposed anesthesia with the patient or authorized representative who has indicated his/her understanding and acceptance.     Plan Discussed with: CRNA, Anesthesiologist and Surgeon  Anesthesia Plan Comments:         Anesthesia Quick Evaluation

## 2014-09-09 NOTE — Anesthesia Procedure Notes (Signed)
Procedure Name: Intubation Date/Time: 09/09/2014 9:38 AM Performed by: Brien Mates D Pre-anesthesia Checklist: Patient identified, Emergency Drugs available, Patient being monitored, Suction available and Timeout performed Patient Re-evaluated:Patient Re-evaluated prior to inductionOxygen Delivery Method: Circle system utilized Preoxygenation: Pre-oxygenation with 100% oxygen Intubation Type: IV induction and Cricoid Pressure applied Ventilation: Mask ventilation without difficulty Laryngoscope Size: Miller and 2 Grade View: Grade I Tube type: Oral Tube size: 7.0 mm Number of attempts: 1 Airway Equipment and Method: Stylet Placement Confirmation: ETT inserted through vocal cords under direct vision,  positive ETCO2 and breath sounds checked- equal and bilateral Secured at: 20 cm Tube secured with: Tape Dental Injury: Teeth and Oropharynx as per pre-operative assessment

## 2014-09-09 NOTE — Progress Notes (Signed)
Report called to Aram Beecham [id number] at 0750.

## 2014-09-10 LAB — COMPREHENSIVE METABOLIC PANEL
ALT: 97 U/L — ABNORMAL HIGH (ref 0–35)
ANION GAP: 8 (ref 5–15)
AST: 37 U/L (ref 0–37)
Albumin: 2.2 g/dL — ABNORMAL LOW (ref 3.5–5.2)
Alkaline Phosphatase: 41 U/L (ref 39–117)
BILIRUBIN TOTAL: 0.7 mg/dL (ref 0.3–1.2)
CALCIUM: 8.2 mg/dL — AB (ref 8.4–10.5)
CHLORIDE: 107 meq/L (ref 96–112)
CO2: 23 meq/L (ref 19–32)
CREATININE: 0.46 mg/dL — AB (ref 0.50–1.10)
GFR calc Af Amer: >90 mL/min (ref >90)
Glucose, Bld: 94 mg/dL (ref 70–99)
Potassium: 3.7 mEq/L (ref 3.7–5.3)
Sodium: 138 mEq/L (ref 137–147)
Total Protein: 5 g/dL — ABNORMAL LOW (ref 6.0–8.3)

## 2014-09-10 LAB — CBC
HEMATOCRIT: 26.6 % — AB (ref 36.0–46.0)
Hemoglobin: 9.6 g/dL — ABNORMAL LOW (ref 12.0–15.0)
MCH: 31.4 pg (ref 26.0–34.0)
MCHC: 36.1 g/dL — ABNORMAL HIGH (ref 30.0–36.0)
MCV: 86.9 fL (ref 78.0–100.0)
PLATELETS: 171 10*3/uL (ref 150–400)
RBC: 3.06 MIL/uL — ABNORMAL LOW (ref 3.87–5.11)
RDW: 13.9 % (ref 11.5–15.5)
WBC: 5.8 10*3/uL (ref 4.0–10.5)

## 2014-09-10 LAB — LIPASE, BLOOD: LIPASE: 116 U/L — AB (ref 11–59)

## 2014-09-10 MED ORDER — DOCUSATE SODIUM 100 MG PO CAPS
100.0000 mg | ORAL_CAPSULE | Freq: Two times a day (BID) | ORAL | Status: DC
  Filled 2014-09-10: qty 1

## 2014-09-10 MED ORDER — DSS 100 MG PO CAPS: 100.0000 mg | ORAL_CAPSULE | Freq: Two times a day (BID) | ORAL | Status: DC | PRN

## 2014-09-10 MED ORDER — PRENATAL MULTIVITAMIN CH: 1.0000 | ORAL_TABLET | Freq: Every day | ORAL | Status: DC

## 2014-09-10 MED ORDER — PANTOPRAZOLE SODIUM 20 MG PO TBEC
20.0000 mg | DELAYED_RELEASE_TABLET | Freq: Every day | ORAL | Status: DC
  Administered 2014-09-10: 20 mg via ORAL
  Filled 2014-09-10: qty 1

## 2014-09-10 MED ORDER — FERROUS GLUCONATE 324 (38 FE) MG PO TABS: 324.0000 mg | ORAL_TABLET | Freq: Two times a day (BID) | ORAL | Status: DC

## 2014-09-10 MED ORDER — HYDROCODONE-ACETAMINOPHEN 5-325 MG PO TABS: 1.0000 | ORAL_TABLET | Freq: Four times a day (QID) | ORAL | Status: DC | PRN

## 2014-09-10 NOTE — Progress Notes (Signed)
Emesis after 1000 meds.  Thinks it was the potassium that upset her.  Will see how pt does with lunch.

## 2014-09-10 NOTE — Progress Notes (Signed)
1 Day Post-Op  Subjective: Sore, but doing reasonably well Tolerating diet PO pain meds seem to be controlling her pain  Objective: Vital signs in last 24 hours: Temp:  [97.9 F (36.6 C)-98.4 F (36.9 C)] 98.2 F (36.8 C) (09/26 0452) Pulse Rate:  [73-92] 73 (09/26 0452) Resp:  [16-26] 16 (09/25 2045) BP: (108-136)/(58-71) 108/61 mmHg (09/26 0452) SpO2:  [100 %] 100 % (09/26 0452) Last BM Date: 09/07/14  Intake/Output from previous day: 09/25 0701 - 09/26 0700 In: 4648.3 [P.O.:1240; I.V.:3408.3] Out: 925 [Urine:900; Blood:25] Intake/Output this shift:    General appearance: alert, cooperative and no distress GI: mildly distended; tender around incisions Dressings - dry  Lab Results:   Recent Labs  09/09/14 0611 09/10/14 0432  WBC 4.9 5.8  HGB 10.1* 9.6*  HCT 27.6* 26.6*  PLT 174 171   BMET  Recent Labs  09/09/14 0611 09/10/14 0432  NA 137 138  K 3.3* 3.7  CL 106 107  CO2 22 23  GLUCOSE 84 94  BUN <3* <3*  CREATININE 0.43* 0.46*  CALCIUM 8.3* 8.2*   Hepatic Function Latest Ref Rng 09/10/2014 09/09/2014 09/08/2014  Total Protein 6.0 - 8.3 g/dL 5.0(L) 5.3(L) 5.3(L)  Albumin 3.5 - 5.2 g/dL 2.2(L) 2.3(L) 2.3(L)  AST 0 - 37 U/L 37 24 34  ALT 0 - 35 U/L 97(H) 107(H) 136(H)  Alk Phosphatase 39 - 117 U/L 41 43 45  Total Bilirubin 0.3 - 1.2 mg/dL 0.7 0.8 1.1   Lipase     Component Value Date/Time   LIPASE 116* 09/10/2014 0432      PT/INR No results found for this basename: LABPROT, INR,  in the last 72 hours ABG No results found for this basename: PHART, PCO2, PO2, HCO3,  in the last 72 hours  Studies/Results: Dg Cholangiogram Operative  09/09/2014   CLINICAL DATA:  Gallstone pancreatitis, laparoscopic cholecystectomy  EXAM: INTRAOPERATIVE CHOLANGIOGRAM  FLUOROSCOPY TIME:  For seconds  COMPARISON:  Abdominal MRCP - 09/05/2014  FINDINGS: Intraoperative angiographic images of the right upper abdominal quadrant during laparoscopic cholecystectomy are  provided for review.  Surgical clips overlie the expected location of the gallbladder fossa.  Contrast injection demonstrates selective cannulation of the central aspect of the cystic duct.  There is passage of contrast through the central aspect of the cystic duct with filling of a non dilated common bile duct. There is passage of contrast though the CBD and into the descending portion of the duodenum.  There is minimal reflux of injected contrast into the common hepatic duct and central aspect of the non dilated intrahepatic biliary system.  There are no discrete filling defects within the opacified portions of the biliary system to suggest the presence of choledocholithiasis.  IMPRESSION: No evidence of choledocholithiasis.   Electronically Signed   By: Sandi Mariscal M.D.   On: 09/09/2014 12:15    Anti-infectives: Anti-infectives   Start     Dose/Rate Route Frequency Ordered Stop   09/05/14 0330  cefTRIAXone (ROCEPHIN) 1 g in dextrose 5 % 50 mL IVPB     1 g 100 mL/hr over 30 Minutes Intravenous Daily at bedtime 09/05/14 0254        Assessment/Plan: s/p Procedure(s): LAPAROSCOPIC CHOLECYSTECTOMY WITH INTRAOPERATIVE CHOLANGIOGRAM (N/A) Pancreatitis continues to improve Cholangiogram negative  Advance to regular diet Saline lock PO pain meds Patient will likely be ready for discharge tomorrow from surgical standpoint.   LOS: 6 days    Tiffney Haughton K. 09/10/2014

## 2014-09-10 NOTE — Discharge Planning (Signed)
Copy of AVS to pt who verbalizes understanding.  D'cd to private car home with all personal belongings, acomp. By family

## 2014-09-10 NOTE — Discharge Summary (Signed)
Physician Discharge Summary  Patient ID: Toni Romero MRN: [id number] DOB/AGE: 1991-01-23 23 y.o.  Admit date: 09/04/2014 Discharge date: 09/10/2014  Primary Care Physician:  No PCP Per Patient  Discharge Diagnoses:    . acute gallstone Pancreatitis . Nausea and vomiting during pregnancy . Elevated LFTs . Hypokalemia . Gallbladder sludge . Weight loss, unintentional . UTI (urinary tract infection) . Anemia  Consults: General surgery,   Allergies:  No Known Allergies   Discharge Medications:   Medication List         cyclobenzaprine 10 MG tablet  Commonly known as:  FLEXERIL  Take 1 tablet (10 mg total) by mouth every 8 (eight) hours as needed for muscle spasms.     Doxylamine-Pyridoxine 10-10 MG Tbec  Commonly known as:  DICLEGIS  Take 2 tabs po at bedtime, if symptoms persists after 2 days, take 1 tablet in the morning and 2 tablet at night     ferrous gluconate 324 MG tablet  Commonly known as:  FERGON  Take 1 tablet (324 mg total) by mouth 2 (two) times daily with a meal.     HYDROcodone-acetaminophen 5-325 MG per tablet  Commonly known as:  NORCO/VICODIN  Take 1 tablet by mouth every 6 (six) hours as needed for moderate pain or severe pain.     metoCLOPramide 10 MG tablet  Commonly known as:  REGLAN  Take 1 tablet (10 mg total) by mouth 3 (three) times daily before meals.     ondansetron 4 MG disintegrating tablet  Commonly known as:  ZOFRAN ODT  Take 1 tablet (4 mg total) by mouth every 8 (eight) hours as needed for nausea or vomiting.     pantoprazole 20 MG tablet  Commonly known as:  PROTONIX  Take 1 tablet (20 mg total) by mouth 2 (two) times daily.     prenatal multivitamin Tabs tablet  Take 1 tablet by mouth daily.     promethazine 25 MG tablet  Commonly known as:  PHENERGAN  Take 12.5-25 mg by mouth every 6 (six) hours as needed for nausea or vomiting.         Brief H and P: For complete details please refer to admission H and P, but  in briefKhaliah Romero is a 22 y.o. African American female with no significant past medical history except patient is G2 P0 A1 with last menstrual cycle on June 23 who is currently 12 weeks and 6 days pregnant who presents with nausea, vomiting, right sided chest pain, shortness of breath, and right upper quadrant abdominal pain. Patient reported that since being pregnant, over the last 2 months patient has had increasing nausea and vomiting. She indicated that she is vomiting about 8 times daily over the last 2 months. She is on an extensive regimen for nausea and vomiting but has not received any relief. She has been to [facility] multiple times for same condition and was diagnosed with hyperemesis gravidarum. Patient indicated that she has lost about 30 pounds since her onset of symptoms. She presented to the emergency department for further evaluation. In the emergency department, she was found to have elevated liver function tests and lipase. Abdominal ultrasound showed gallbladder sludge without sonographic findings of acute cholecystitis. ED physician, discussed case with Dr. Jolayne Panther, patient's OB physician, who thought patient likely had GI etiology for her symptoms and recommended patient be admitted to [facility] Course:   Nausea and vomiting during pregnancy worsened likely due to acute pancreatitis,  choledocholithiasis with obstruction  Patient was hospitalized and placed on n.p.o. status with IV fluids and pain control. gastroenterology was consulted, patient underwent MRCP which was negative for CBD stone or CBD dilatation. General surgery follow the patient closely and patient underwent laparoscopic cholecystectomy on 09/09/14. Post surgery, patient has been doing well and tolerating solid diet without any difficulty. She will followup with Dr Corliss Skains outpatient in 3 weeks. Intraoperative cholangiogram was negative for choledocholithiasis.  Elevated liver function  tests and lipase  - Due to #1, trending down   Anemia: Possibly worsened due to hemodilution. Patient was transfused 2 units of packed RBCs. Hemoglobin is 9.6 at the time of discharge. Started on prenatal vitamin and iron supplement.   Urinary tract infection  - Patient was placed on IV Rocephin, urine culture and sensitivities showed 75,000 colonies given her pregnancy, she was continued on IV Rocephin till discharge.   Second trimester Pregnancy  - Patient to follow with [facility] physician Dr. Jolayne Panther.   Day of Discharge BP 108/61  Pulse 73  Temp(Src) 98.2 F (36.8 C) (Oral)  Resp 16  Ht  (1.626 m)  Wt 56.645 kg (124 lb 14.1 oz)  BMI 21.42 kg/m2  SpO2 100%  LMP 06/07/2014  Physical Exam: General: Alert and awake oriented x3 not in any acute distress. CVS: S1-S2 clear no murmur rubs or gallops Chest: clear to auscultation bilaterally, no wheezing rales or rhonchi Abdomen: soft, incisions intact, nondistended, normal bowel sounds Extremities: no cyanosis, clubbing or edema noted bilaterally    The results of significant diagnostics from this hospitalization (including imaging, microbiology, ancillary and laboratory) are listed below for reference.    LAB RESULTS: Basic Metabolic Panel:  Recent Labs Lab 09/06/14 0406  09/09/14 0611 09/10/14 0432  NA 137  < > 137 138  K 3.2*  < > 3.3* 3.7  CL 103  < > 106 107  CO2 20  < > 22 23  GLUCOSE 62*  < > 84 94  BUN 9  < > <3* <3*  CREATININE 0.57  < > 0.43* 0.46*  CALCIUM 8.5  < > 8.3* 8.2*  MG 1.7  --   --   --   < > = values in this interval not displayed. Liver Function Tests:  Recent Labs Lab 09/09/14 0611 09/10/14 0432  AST 24 37  ALT 107* 97*  ALKPHOS 43 41  BILITOT 0.8 0.7  PROT 5.3* 5.0*  ALBUMIN 2.3* 2.2*    Recent Labs Lab 09/09/14 0611 09/10/14 0432  LIPASE 238* 116*   No results found for this basename: AMMONIA,  in the last 168 hours CBC:  Recent Labs Lab 09/04/14 2129  09/09/14 0611  09/10/14 0432  WBC 6.7  < > 4.9 5.8  NEUTROABS 4.7  --   --   --   HGB 13.8  < > 10.1* 9.6*  HCT 37.1  < > 27.6* 26.6*  MCV 87.3  < > 84.1 86.9  PLT 201  < > 174 171  < > = values in this interval not displayed. Cardiac Enzymes: No results found for this basename: CKTOTAL, CKMB, CKMBINDEX, TROPONINI,  in the last 168 hours BNP: No components found with this basename: POCBNP,  CBG: No results found for this basename: GLUCAP,  in the last 168 hours  Significant Diagnostic Studies:  US Abdomen Complete  09/05/2014   CLINICAL DATA:  Elevated lipase and T bili, emesis during pregnancy.  EXAM: ULTRASOUND ABDOMEN COMPLETE  COMPARISON:  None.  FINDINGS: Gallbladder:  Multiple punctate echogenic foci central within the gallbladder. No gallbladder wall thickening or pericholecystic fluid. No sonographic Murphy's sign elicited.  Common bile duct:  Diameter: 2 mm  Liver:  No focal lesion identified. Within normal limits in parenchymal echogenicity.  IVC:  No abnormality visualized.  Pancreas:  Visualized portion unremarkable.  Spleen:  Size and appearance within normal limits.  Right Kidney:  Length: 10.4 cm. Echogenicity within normal limits. No mass or hydronephrosis visualized.  Left Kidney:  Length: 10.1 cm. Echogenicity within normal limits. No mass or hydronephrosis visualized.  Abdominal aorta:  No aneurysm visualized.  Other findings:  None.  IMPRESSION: Gallbladder sludge without sonographic findings of acute cholecystitis.   Electronically Signed   By: Awilda Metro   On: 09/05/2014 00:42   Mr Abdomen Mrcp Wo Cm  09/05/2014   CLINICAL DATA:  Pregnant patient with nausea, vomiting and right-sided chest pain. Shortness of breath. Right upper quadrant abdominal pain. Elevated liver function tests and elevated lipase.  EXAM: MRI ABDOMEN WITHOUT CONTRAST  (INCLUDING MRCP)  TECHNIQUE: Multiplanar multisequence MR imaging of the abdomen was performed. Heavily T2-weighted images of the biliary and  pancreatic ducts were obtained, and three-dimensional MRCP images were rendered by post processing.  COMPARISON:  None.  FINDINGS: No focal hepatic lesions are noted. There is diffuse increased T2 signal intensity adjacent to the portal triads, compatible with periportal edema. MRCP images demonstrate no intra or extrahepatic biliary ductal dilatation. The common bile duct is normal in caliber measuring 3 mm in the porta hepatis. Pancreatic duct is not dilated. The gallbladder does not appear distended. No filling defects within the gallbladder or within the common bile duct to suggest presence of cholelithiasis or choledocholithiasis.  There is a small amount of increased T2 signal intensity in the retroperitoneum adjacent to the pancreas. No well-defined peripancreatic fluid collection is noted. The appearance of the spleen, bilateral adrenal glands and bilateral kidneys is unremarkable on today's non contrast MRI examination.  IMPRESSION: 1. No intra or extrahepatic biliary ductal dilatation. 2. Periportal edema in the liver. 3. Small amount of increased T2 signal intensity in the retroperitoneum adjacent to the pancreas, presumably secondary to inflammatory changes given the patient's elevated lipase.   Electronically Signed   By: Trudie Reed M.D.   On: 09/05/2014 19:01   Mr 3d Recon At Scanner  09/05/2014   CLINICAL DATA:  Pregnant patient with nausea, vomiting and right-sided chest pain. Shortness of breath. Right upper quadrant abdominal pain. Elevated liver function tests and elevated lipase.  EXAM: MRI ABDOMEN WITHOUT CONTRAST  (INCLUDING MRCP)  TECHNIQUE: Multiplanar multisequence MR imaging of the abdomen was performed. Heavily T2-weighted images of the biliary and pancreatic ducts were obtained, and three-dimensional MRCP images were rendered by post processing.  COMPARISON:  None.  FINDINGS: No focal hepatic lesions are noted. There is diffuse increased T2 signal intensity adjacent to the portal  triads, compatible with periportal edema. MRCP images demonstrate no intra or extrahepatic biliary ductal dilatation. The common bile duct is normal in caliber measuring 3 mm in the porta hepatis. Pancreatic duct is not dilated. The gallbladder does not appear distended. No filling defects within the gallbladder or within the common bile duct to suggest presence of cholelithiasis or choledocholithiasis.  There is a small amount of increased T2 signal intensity in the retroperitoneum adjacent to the pancreas. No well-defined peripancreatic fluid collection is noted. The appearance of the spleen, bilateral adrenal glands and bilateral kidneys is unremarkable on today's non  contrast MRI examination.  IMPRESSION: 1. No intra or extrahepatic biliary ductal dilatation. 2. Periportal edema in the liver. 3. Small amount of increased T2 signal intensity in the retroperitoneum adjacent to the pancreas, presumably secondary to inflammatory changes given the patient's elevated lipase.   Electronically Signed   By: Trudie Reed M.D.   On: 09/05/2014 19:01      Disposition and Follow-up:     Discharge Instructions   Diet general    Complete by:  As directed      Discharge instructions    Complete by:  As directed   Avoid fried spicy foods     Increase activity slowly    Complete by:  As directed             DISPOSITION: home  DIET: regular    DISCHARGE FOLLOW-UP Follow-up Information   Follow up with TSUEI,MATTHEW K., MD. Schedule an appointment as soon as possible for a visit in 3 weeks. (for hospital follow-up)    Specialty:  General Surgery   Contact information:   [address] [place] [address] [phone]       Time spent on Discharge: 40 mins   Signed:   RAI,RIPUDEEP M.D. [facility] 09/10/2014, 9:02 AM Pager: [phone]

## 2014-09-13 ENCOUNTER — Encounter (HOSPITAL_COMMUNITY): Payer: Self-pay | Admitting: Surgery

## 2014-09-16 ENCOUNTER — Encounter: Payer: Self-pay | Admitting: Obstetrics & Gynecology

## 2014-09-16 ENCOUNTER — Ambulatory Visit (INDEPENDENT_AMBULATORY_CARE_PROVIDER_SITE_OTHER): Payer: 59 | Admitting: Obstetrics & Gynecology

## 2014-09-16 VITALS — BP 129/75 | HR 95 | Wt 119.0 lb

## 2014-09-16 DIAGNOSIS — Z23 Encounter for immunization: Secondary | ICD-10-CM

## 2014-09-16 DIAGNOSIS — Z3492 Encounter for supervision of normal pregnancy, unspecified, second trimester: Secondary | ICD-10-CM

## 2014-09-16 NOTE — Progress Notes (Signed)
Routine visit. No problems. Feels much better since her GB was removed 1 week ago

## 2014-10-06 ENCOUNTER — Other Ambulatory Visit (HOSPITAL_COMMUNITY): Payer: Self-pay | Admitting: Maternal and Fetal Medicine

## 2014-10-06 DIAGNOSIS — O3510X1 Maternal care for (suspected) chromosomal abnormality in fetus, unspecified, fetus 1: Secondary | ICD-10-CM

## 2014-10-06 DIAGNOSIS — O351XX1 Maternal care for (suspected) chromosomal abnormality in fetus, fetus 1: Secondary | ICD-10-CM

## 2014-10-11 ENCOUNTER — Ambulatory Visit (HOSPITAL_COMMUNITY)
Admission: RE | Admit: 2014-10-11 | Discharge: 2014-10-11 | Disposition: A | Discharge status: Home / Self Care | Payer: 59 | Source: Ambulatory Visit | Attending: Obstetrics & Gynecology | Admitting: Obstetrics & Gynecology

## 2014-10-11 ENCOUNTER — Other Ambulatory Visit (HOSPITAL_COMMUNITY): Payer: Self-pay | Admitting: Maternal and Fetal Medicine

## 2014-10-11 DIAGNOSIS — O351XX1 Maternal care for (suspected) chromosomal abnormality in fetus, fetus 1: Secondary | ICD-10-CM

## 2014-10-11 DIAGNOSIS — Z36 Encounter for antenatal screening of mother: Secondary | ICD-10-CM | POA: Insufficient documentation

## 2014-10-11 DIAGNOSIS — Z3A18 18 weeks gestation of pregnancy: Secondary | ICD-10-CM | POA: Diagnosis not present

## 2014-10-11 DIAGNOSIS — O351XX0 Maternal care for (suspected) chromosomal abnormality in fetus, not applicable or unspecified: Secondary | ICD-10-CM | POA: Insufficient documentation

## 2014-10-11 DIAGNOSIS — Z1389 Encounter for screening for other disorder: Secondary | ICD-10-CM

## 2014-10-11 DIAGNOSIS — O3510X1 Maternal care for (suspected) chromosomal abnormality in fetus, unspecified, fetus 1: Secondary | ICD-10-CM

## 2014-10-14 ENCOUNTER — Other Ambulatory Visit (HOSPITAL_COMMUNITY): Payer: 59

## 2014-10-14 ENCOUNTER — Ambulatory Visit (HOSPITAL_COMMUNITY): Payer: 59

## 2014-10-17 ENCOUNTER — Encounter: Payer: Self-pay | Admitting: Obstetrics & Gynecology

## 2014-10-17 ENCOUNTER — Ambulatory Visit (INDEPENDENT_AMBULATORY_CARE_PROVIDER_SITE_OTHER): Payer: 59 | Admitting: Obstetrics & Gynecology

## 2014-10-17 ENCOUNTER — Encounter: Payer: 59 | Admitting: Obstetrics & Gynecology

## 2014-10-17 ENCOUNTER — Other Ambulatory Visit: Payer: Self-pay | Admitting: Obstetrics & Gynecology

## 2014-10-17 VITALS — BP 90/50 | HR 78 | Wt 123.8 lb

## 2014-10-17 DIAGNOSIS — Z36 Encounter for antenatal screening of mother: Secondary | ICD-10-CM

## 2014-10-17 DIAGNOSIS — Z3402 Encounter for supervision of normal first pregnancy, second trimester: Secondary | ICD-10-CM

## 2014-10-17 DIAGNOSIS — K828 Other specified diseases of gallbladder: Secondary | ICD-10-CM

## 2014-10-17 NOTE — Progress Notes (Signed)
Pt c/o HA.  They are relieved with Tylenol.  Her anatomy scan requires update for completion of anatomy Quad screen today S/p cholecystectomy last wee feels much better

## 2014-10-17 NOTE — Patient Instructions (Signed)

## 2014-10-18 LAB — AFP TUMOR MARKER: AFP TUMOR MARKER: 58.3 ng/mL — AB (ref <6.1)

## 2014-10-19 LAB — ALPHA FETOPROTEIN, MATERNAL
AFP: 59 ng/mL
Curr Gest Age: 18.5 wks.days
MoM for AFP: 0.98
Open Spina bifida: NEGATIVE
Osb Risk: 1:28400 {titer}

## 2014-11-14 ENCOUNTER — Encounter: Payer: 59 | Admitting: Family Medicine

## 2014-11-15 ENCOUNTER — Ambulatory Visit (INDEPENDENT_AMBULATORY_CARE_PROVIDER_SITE_OTHER): Payer: 59 | Admitting: Obstetrics & Gynecology

## 2014-11-15 VITALS — BP 110/59 | HR 78 | Wt 126.0 lb

## 2014-11-15 DIAGNOSIS — Z3492 Encounter for supervision of normal pregnancy, unspecified, second trimester: Secondary | ICD-10-CM

## 2014-11-15 NOTE — Progress Notes (Signed)
Routine visit. Good FM. "All day". No OB problems. Anatomy u/s follow up u/s is 11/29/14. Labs, glucola, TDAP at next visit

## 2014-11-29 ENCOUNTER — Ambulatory Visit (HOSPITAL_COMMUNITY)
Admission: RE | Admit: 2014-11-29 | Discharge: 2014-11-29 | Disposition: A | Discharge status: Home / Self Care | Payer: 59 | Source: Ambulatory Visit | Attending: Obstetrics & Gynecology | Admitting: Obstetrics & Gynecology

## 2014-11-29 DIAGNOSIS — Z3402 Encounter for supervision of normal first pregnancy, second trimester: Secondary | ICD-10-CM

## 2014-11-29 DIAGNOSIS — Z3A25 25 weeks gestation of pregnancy: Secondary | ICD-10-CM | POA: Insufficient documentation

## 2014-11-29 DIAGNOSIS — Z36 Encounter for antenatal screening of mother: Secondary | ICD-10-CM | POA: Insufficient documentation

## 2014-11-29 DIAGNOSIS — Z3492 Encounter for supervision of normal pregnancy, unspecified, second trimester: Secondary | ICD-10-CM

## 2014-12-13 ENCOUNTER — Ambulatory Visit (INDEPENDENT_AMBULATORY_CARE_PROVIDER_SITE_OTHER): Payer: 59 | Admitting: Obstetrics & Gynecology

## 2014-12-13 VITALS — BP 105/52 | HR 70 | Wt 133.0 lb

## 2014-12-13 DIAGNOSIS — Z3402 Encounter for supervision of normal first pregnancy, second trimester: Secondary | ICD-10-CM

## 2014-12-13 DIAGNOSIS — Z23 Encounter for immunization: Secondary | ICD-10-CM

## 2014-12-13 NOTE — Progress Notes (Signed)
Routine visit. Good FM. Labs, tdap, glucola today. No problems.

## 2014-12-16 NOTE — L&D Delivery Note (Signed)
Delivery Note At 2:03 PM a healthy female was delivered via Vaginal, Spontaneous Delivery (Presentation: Right Occiput Anterior).  APGAR: 9, 9; weight  .   Placenta status: Intact, Spontaneous.  Cord: 3 vessels with the following complications: None.    Anesthesia: Epidural  Episiotomy: None Lacerations: Sulcus;1st degree;Labial Suture Repair: 3.0 vicryl Est. Blood Loss (mL):  200mL  Mom to postpartum.  Baby to Couplet care / Skin to Skin.  Feeding: breast Contraception: mirena  Toni Romero 03/10/2015, 2:25 PM

## 2014-12-19 ENCOUNTER — Other Ambulatory Visit (INDEPENDENT_AMBULATORY_CARE_PROVIDER_SITE_OTHER): Payer: 59 | Admitting: *Deleted

## 2014-12-19 DIAGNOSIS — Z36 Encounter for antenatal screening of mother: Secondary | ICD-10-CM

## 2014-12-19 NOTE — Progress Notes (Signed)
Pt came in for labs today.   

## 2014-12-20 LAB — GLUCOSE TOLERANCE, 1 HOUR (50G) W/O FASTING: Glucose, 1 Hour GTT: 93 mg/dL (ref 70–140)

## 2014-12-20 LAB — CBC
HCT: 29.2 % — ABNORMAL LOW (ref 36.0–46.0)
Hemoglobin: 9.7 g/dL — ABNORMAL LOW (ref 12.0–15.0)
MCH: 32.4 pg (ref 26.0–34.0)
MCHC: 33.2 g/dL (ref 30.0–36.0)
MCV: 97.7 fL (ref 78.0–100.0)
MPV: 9.6 fL (ref 8.6–12.4)
Platelets: 231 10*3/uL (ref 150–400)
RBC: 2.99 MIL/uL — ABNORMAL LOW (ref 3.87–5.11)
RDW: 13.5 % (ref 11.5–15.5)
WBC: 6.8 10*3/uL (ref 4.0–10.5)

## 2014-12-20 LAB — HIV ANTIBODY (ROUTINE TESTING W REFLEX): HIV 1&2 Ab, 4th Generation: NONREACTIVE

## 2014-12-20 LAB — RPR

## 2015-01-05 ENCOUNTER — Ambulatory Visit (INDEPENDENT_AMBULATORY_CARE_PROVIDER_SITE_OTHER): Payer: 59 | Admitting: Obstetrics & Gynecology

## 2015-01-05 VITALS — BP 118/54 | HR 78 | Wt 138.0 lb

## 2015-01-05 DIAGNOSIS — Z3493 Encounter for supervision of normal pregnancy, unspecified, third trimester: Secondary | ICD-10-CM

## 2015-01-05 NOTE — Progress Notes (Signed)
Normal 1 hr GTT and labs.  No other complaints or concerns.  Labor and fetal movement precautions reviewed.

## 2015-01-05 NOTE — Patient Instructions (Signed)
Return to clinic for any obstetric concerns or go to [facility] for evaluation  

## 2015-01-19 ENCOUNTER — Encounter: Payer: Self-pay | Admitting: Obstetrics and Gynecology

## 2015-01-19 ENCOUNTER — Ambulatory Visit (INDEPENDENT_AMBULATORY_CARE_PROVIDER_SITE_OTHER): Payer: 59 | Admitting: Obstetrics and Gynecology

## 2015-01-19 VITALS — BP 102/76 | HR 86 | Wt 140.0 lb

## 2015-01-19 DIAGNOSIS — Z3493 Encounter for supervision of normal pregnancy, unspecified, third trimester: Secondary | ICD-10-CM

## 2015-01-19 NOTE — Progress Notes (Signed)
Patient is doing well without complaints. FM/PTL precautions reviewed.  

## 2015-02-02 ENCOUNTER — Ambulatory Visit (INDEPENDENT_AMBULATORY_CARE_PROVIDER_SITE_OTHER): Payer: 59 | Admitting: Obstetrics & Gynecology

## 2015-02-02 VITALS — BP 122/60 | HR 86 | Wt 141.0 lb

## 2015-02-02 DIAGNOSIS — Z3493 Encounter for supervision of normal pregnancy, unspecified, third trimester: Secondary | ICD-10-CM

## 2015-02-02 NOTE — Patient Instructions (Signed)
Return to clinic for any obstetric concerns or go to [facility] for evaluation  

## 2015-02-02 NOTE — Progress Notes (Signed)
Pelvic cultures next visit. No other complaints or concerns.  Labor and fetal movement precautions reviewed. 

## 2015-02-15 ENCOUNTER — Encounter: Payer: Self-pay | Admitting: Obstetrics and Gynecology

## 2015-02-15 ENCOUNTER — Encounter: Payer: 59 | Admitting: Obstetrics and Gynecology

## 2015-02-15 ENCOUNTER — Ambulatory Visit (INDEPENDENT_AMBULATORY_CARE_PROVIDER_SITE_OTHER): Payer: 59 | Admitting: Obstetrics and Gynecology

## 2015-02-15 VITALS — BP 110/55 | HR 82 | Wt 146.0 lb

## 2015-02-15 DIAGNOSIS — O219 Vomiting of pregnancy, unspecified: Secondary | ICD-10-CM

## 2015-02-15 DIAGNOSIS — Z36 Encounter for antenatal screening of mother: Secondary | ICD-10-CM

## 2015-02-15 DIAGNOSIS — Z3483 Encounter for supervision of other normal pregnancy, third trimester: Secondary | ICD-10-CM

## 2015-02-15 DIAGNOSIS — Z3493 Encounter for supervision of normal pregnancy, unspecified, third trimester: Secondary | ICD-10-CM

## 2015-02-15 LAB — OB RESULTS CONSOLE GBS: STREP GROUP B AG: NEGATIVE

## 2015-02-15 NOTE — Patient Instructions (Signed)
Third Trimester of Pregnancy The third trimester is from week 29 through week 42, months 7 through 9. The third trimester is a time when the fetus is growing rapidly. At the end of the ninth month, the fetus is about 20 inches in length and weighs 6-10 pounds.  BODY CHANGES Your body goes through many changes during pregnancy. The changes vary from woman to woman.   Your weight will continue to increase. You can expect to gain 25-35 pounds (11-16 kg) by the end of the pregnancy.  You may begin to get stretch marks on your hips, abdomen, and breasts.  You may urinate more often because the fetus is moving lower into your pelvis and pressing on your bladder.  You may develop or continue to have heartburn as a result of your pregnancy.  You may develop constipation because certain hormones are causing the muscles that push waste through your intestines to slow down.  You may develop hemorrhoids or swollen, bulging veins (varicose veins).  You may have pelvic pain because of the weight gain and pregnancy hormones relaxing your joints between the bones in your pelvis. Backaches may result from overexertion of the muscles supporting your posture.  You may have changes in your hair. These can include thickening of your hair, rapid growth, and changes in texture. Some women also have hair loss during or after pregnancy, or hair that feels dry or thin. Your hair will most likely return to normal after your baby is born.  Your breasts will continue to grow and be tender. A yellow discharge may leak from your breasts called colostrum.  Your belly button may stick out.  You may feel short of breath because of your expanding uterus.  You may notice the fetus "dropping," or moving lower in your abdomen.  You may have a bloody mucus discharge. This usually occurs a few days to a week before labor begins.  Your cervix becomes thin and soft (effaced) near your due date. WHAT TO EXPECT AT YOUR PRENATAL  EXAMS  You will have prenatal exams every 2 weeks until week 36. Then, you will have weekly prenatal exams. During a routine prenatal visit:  You will be weighed to make sure you and the fetus are growing normally.  Your blood pressure is taken.  Your abdomen will be measured to track your baby's growth.  The fetal heartbeat will be listened to.  Any test results from the previous visit will be discussed.  You may have a cervical check near your due date to see if you have effaced. At around 36 weeks, your caregiver will check your cervix. At the same time, your caregiver will also perform a test on the secretions of the vaginal tissue. This test is to determine if a type of bacteria, Group B streptococcus, is present. Your caregiver will explain this further. Your caregiver may ask you:  What your birth plan is.  How you are feeling.  If you are feeling the baby move.  If you have had any abnormal symptoms, such as leaking fluid, bleeding, severe headaches, or abdominal cramping.  If you have any questions. Other tests or screenings that may be performed during your third trimester include:  Blood tests that check for low iron levels (anemia).  Fetal testing to check the health, activity level, and growth of the fetus. Testing is done if you have certain medical conditions or if there are problems during the pregnancy. FALSE LABOR You may feel small, irregular contractions that   eventually go away. These are called Braxton Hicks contractions, or false labor. Contractions may last for hours, days, or even weeks before true labor sets in. If contractions come at regular intervals, intensify, or become painful, it is best to be seen by your caregiver.  SIGNS OF LABOR   Menstrual-like cramps.  Contractions that are 5 minutes apart or less.  Contractions that start on the top of the uterus and spread down to the lower abdomen and back.  A sense of increased pelvic pressure or back  pain.  A watery or bloody mucus discharge that comes from the vagina. If you have any of these signs before the 37th week of pregnancy, call your caregiver right away. You need to go to the hospital to get checked immediately. HOME CARE INSTRUCTIONS   Avoid all smoking, herbs, alcohol, and unprescribed drugs. These chemicals affect the formation and growth of the baby.  Follow your caregiver's instructions regarding medicine use. There are medicines that are either safe or unsafe to take during pregnancy.  Exercise only as directed by your caregiver. Experiencing uterine cramps is a good sign to stop exercising.  Continue to eat regular, healthy meals.  Wear a good support bra for breast tenderness.  Do not use hot tubs, steam rooms, or saunas.  Wear your seat belt at all times when driving.  Avoid raw meat, uncooked cheese, cat litter boxes, and soil used by cats. These carry germs that can cause birth defects in the baby.  Take your prenatal vitamins.  Try taking a stool softener (if your caregiver approves) if you develop constipation. Eat more high-fiber foods, such as fresh vegetables or fruit and whole grains. Drink plenty of fluids to keep your urine clear or pale yellow.  Take warm sitz baths to soothe any pain or discomfort caused by hemorrhoids. Use hemorrhoid cream if your caregiver approves.  If you develop varicose veins, wear support hose. Elevate your feet for 15 minutes, 3-4 times a day. Limit salt in your diet.  Avoid heavy lifting, wear low heal shoes, and practice good posture.  Rest a lot with your legs elevated if you have leg cramps or low back pain.  Visit your dentist if you have not gone during your pregnancy. Use a soft toothbrush to brush your teeth and be gentle when you floss.  A sexual relationship may be continued unless your caregiver directs you otherwise.  Do not travel far distances unless it is absolutely necessary and only with the approval  of your caregiver.  Take prenatal classes to understand, practice, and ask questions about the labor and delivery.  Make a trial run to the hospital.  Pack your hospital bag.  Prepare the baby's nursery.  Continue to go to all your prenatal visits as directed by your caregiver. SEEK MEDICAL CARE IF:  You are unsure if you are in labor or if your water has broken.  You have dizziness.  You have mild pelvic cramps, pelvic pressure, or nagging pain in your abdominal area.  You have persistent nausea, vomiting, or diarrhea.  You have a bad smelling vaginal discharge.  You have pain with urination. SEEK IMMEDIATE MEDICAL CARE IF:   You have a fever.  You are leaking fluid from your vagina.  You have spotting or bleeding from your vagina.  You have severe abdominal cramping or pain.  You have rapid weight loss or gain.  You have shortness of breath with chest pain.  You notice sudden or extreme swelling   of your face, hands, ankles, feet, or legs.  You have not felt your baby move in over an hour.  You have severe headaches that do not go away with medicine.  You have vision changes. Document Released: 11/26/2001 Document Revised: 12/07/2013 Document Reviewed: 02/02/2013 Exit[employer] Patient Information 2015 [employer]. This information is not intended to replace advice given to you by your health care provider. Make sure you discuss any questions you have with your health care provider.  

## 2015-02-15 NOTE — Progress Notes (Signed)
Doing well. Fetus active. Discussed plans, birthing and new parenting concerns. Advised classes. Cultures done. Labor precautions.

## 2015-02-16 ENCOUNTER — Encounter: Payer: 59 | Admitting: Family Medicine

## 2015-02-16 LAB — GC/CHLAMYDIA PROBE AMP, GENITAL

## 2015-02-17 LAB — CULTURE, STREPTOCOCCUS GRP B W/SUSCEPT

## 2015-02-21 ENCOUNTER — Encounter: Payer: 59 | Admitting: Family Medicine

## 2015-02-22 ENCOUNTER — Ambulatory Visit (INDEPENDENT_AMBULATORY_CARE_PROVIDER_SITE_OTHER): Payer: 59 | Admitting: Advanced Practice Midwife

## 2015-02-22 VITALS — BP 115/54 | HR 87 | Wt 148.0 lb

## 2015-02-22 DIAGNOSIS — Z3483 Encounter for supervision of other normal pregnancy, third trimester: Secondary | ICD-10-CM

## 2015-02-22 DIAGNOSIS — Z3493 Encounter for supervision of normal pregnancy, unspecified, third trimester: Secondary | ICD-10-CM

## 2015-02-22 DIAGNOSIS — Z36 Encounter for antenatal screening of mother: Secondary | ICD-10-CM | POA: Diagnosis not present

## 2015-02-22 NOTE — Progress Notes (Signed)
Increased BH. GC/CT today. GBS Neg.

## 2015-02-22 NOTE — Patient Instructions (Addendum)
Levonorgestrel intrauterine device (IUD) What is this medicine? LEVONORGESTREL IUD (LEE voe nor jes trel) is a contraceptive (birth control) device. The device is placed inside the uterus by a healthcare professional. It is used to prevent pregnancy and can also be used to treat heavy bleeding that occurs during your period. Depending on the device, it can be used for 3 to 5 years. This medicine may be used for other purposes; ask your health care provider or pharmacist if you have questions. COMMON BRAND NAME(S): LILETTA, Mirena, Skyla What should I tell my health care provider before I take this medicine? They need to know if you have any of these conditions: -abnormal Pap smear -cancer of the breast, uterus, or cervix -diabetes -endometritis -genital or pelvic infection now or in the past -have more than one sexual partner or your partner has more than one partner -heart disease -history of an ectopic or tubal pregnancy -immune system problems -IUD in place -liver disease or tumor -problems with blood clots or take blood-thinners -use intravenous drugs -uterus of unusual shape -vaginal bleeding that has not been explained -an unusual or allergic reaction to levonorgestrel, other hormones, silicone, or polyethylene, medicines, foods, dyes, or preservatives -pregnant or trying to get pregnant -breast-feeding How should I use this medicine? This device is placed inside the uterus by a health care professional. Talk to your pediatrician regarding the use of this medicine in children. Special care may be needed. Overdosage: If you think you have taken too much of this medicine contact a poison control center or emergency room at once. NOTE: This medicine is only for you. Do not share this medicine with others. What if I miss a dose? This does not apply. What may interact with this medicine? Do not take this medicine with any of the following  medications: -amprenavir -bosentan -fosamprenavir This medicine may also interact with the following medications: -aprepitant -barbiturate medicines for inducing sleep or treating seizures -bexarotene -griseofulvin -medicines to treat seizures like carbamazepine, ethotoin, felbamate, oxcarbazepine, phenytoin, topiramate -modafinil -pioglitazone -rifabutin -rifampin -rifapentine -some medicines to treat HIV infection like atazanavir, indinavir, lopinavir, nelfinavir, tipranavir, ritonavir -St. John's wort -warfarin This list may not describe all possible interactions. Give your health care provider a list of all the medicines, herbs, non-prescription drugs, or dietary supplements you use. Also tell them if you smoke, drink alcohol, or use illegal drugs. Some items may interact with your medicine. What should I watch for while using this medicine? Visit your doctor or health care professional for regular check ups. See your doctor if you or your partner has sexual contact with others, becomes HIV positive, or gets a sexual transmitted disease. This product does not protect you against HIV infection (AIDS) or other sexually transmitted diseases. You can check the placement of the IUD yourself by reaching up to the top of your vagina with clean fingers to feel the threads. Do not pull on the threads. It is a good habit to check placement after each menstrual period. Call your doctor right away if you feel more of the IUD than just the threads or if you cannot feel the threads at all. The IUD may come out by itself. You may become pregnant if the device comes out. If you notice that the IUD has come out use a backup birth control method like condoms and call your health care provider. Using tampons will not change the position of the IUD and are okay to use during your period. What side effects may   I notice from receiving this medicine? Side effects that you should report to your doctor or  health care professional as soon as possible: -allergic reactions like skin rash, itching or hives, swelling of the face, lips, or tongue -fever, flu-like symptoms -genital sores -high blood pressure -no menstrual period for 6 weeks during use -pain, swelling, warmth in the leg -pelvic pain or tenderness -severe or sudden headache -signs of pregnancy -stomach cramping -sudden shortness of breath -trouble with balance, talking, or walking -unusual vaginal bleeding, discharge -yellowing of the eyes or skin Side effects that usually do not require medical attention (report to your doctor or health care professional if they continue or are bothersome): -acne -breast pain -change in sex drive or performance -changes in weight -cramping, dizziness, or faintness while the device is being inserted -headache -irregular menstrual bleeding within first 3 to 6 months of use -nausea This list may not describe all possible side effects. Call your doctor for medical advice about side effects. You may report side effects to FDA at 1-800-FDA-1088. Where should I keep my medicine? This does not apply. NOTE: This sheet is a summary. It may not cover all possible information. If you have questions about this medicine, talk to your doctor, pharmacist, or health care provider.  2015, Elsevier/Gold Standard. (2012-01-02 13:54:04)  Deberah Pelton Contractions Contractions of the uterus can occur throughout pregnancy. Contractions are not always a sign that you are in labor.  WHAT ARE BRAXTON HICKS CONTRACTIONS?  Contractions that occur before labor are called Braxton Hicks contractions, or false labor. Toward the end of pregnancy (32-34 weeks), these contractions can develop more often and may become more forceful. This is not true labor because these contractions do not result in opening (dilatation) and thinning of the cervix. They are sometimes difficult to tell apart from true labor because these  contractions can be forceful and people have different pain tolerances. You should not feel embarrassed if you go to the hospital with false labor. Sometimes, the only way to tell if you are in true labor is for your health care provider to look for changes in the cervix. If there are no prenatal problems or other health problems associated with the pregnancy, it is completely safe to be sent home with false labor and await the onset of true labor. HOW CAN YOU TELL THE DIFFERENCE BETWEEN TRUE AND FALSE LABOR? False Labor  The contractions of false labor are usually shorter and not as hard as those of true labor.   The contractions are usually irregular.   The contractions are often felt in the front of the lower abdomen and in the groin.   The contractions may go away when you walk around or change positions while lying down.   The contractions get weaker and are shorter lasting as time goes on.   The contractions do not usually become progressively stronger, regular, and closer together as with true labor.  True Labor  Contractions in true labor last 30-70 seconds, become very regular, usually become more intense, and increase in frequency.   The contractions do not go away with walking.   The discomfort is usually felt in the top of the uterus and spreads to the lower abdomen and low back.   True labor can be determined by your health care provider with an exam. This will show that the cervix is dilating and getting thinner.  WHAT TO REMEMBER  Keep up with your usual exercises and follow other instructions given by  your health care provider.   Take medicines as directed by your health care provider.   Keep your regular prenatal appointments.   Eat and drink lightly if you think you are going into labor.   If Braxton Hicks contractions are making you uncomfortable:   Change your position from lying down or resting to walking, or from walking to resting.   Sit  and rest in a tub of warm water.   Drink 2-3 glasses of water. Dehydration may cause these contractions.   Do slow and deep breathing several times an hour.  WHEN SHOULD I SEEK IMMEDIATE MEDICAL CARE? Seek immediate medical care if:  Your contractions become stronger, more regular, and closer together.   You have fluid leaking or gushing from your vagina.   You have a fever.   You pass blood-tinged mucus.   You have vaginal bleeding.   You have continuous abdominal pain.   You have low back pain that you never had before.   You feel your baby's head pushing down and causing pelvic pressure.   Your baby is not moving as much as it used to.  Document Released: 12/02/2005 Document Revised: 12/07/2013 Document Reviewed: 09/13/2013 [facility] Patient Information 2015 [employer], [place]. This information is not intended to replace advice given to you by your health care provider. Make sure you discuss any questions you have with your health care provider.

## 2015-02-23 LAB — GC/CHLAMYDIA PROBE AMP, URINE
Chlamydia, Swab/Urine, PCR: NEGATIVE
GC PROBE AMP, URINE: NEGATIVE

## 2015-03-01 ENCOUNTER — Encounter: Payer: Self-pay | Admitting: Certified Nurse Midwife

## 2015-03-01 ENCOUNTER — Ambulatory Visit (INDEPENDENT_AMBULATORY_CARE_PROVIDER_SITE_OTHER): Payer: 59 | Admitting: Certified Nurse Midwife

## 2015-03-01 VITALS — BP 107/57 | HR 77 | Wt 146.0 lb

## 2015-03-01 DIAGNOSIS — Z3493 Encounter for supervision of normal pregnancy, unspecified, third trimester: Secondary | ICD-10-CM

## 2015-03-01 NOTE — Progress Notes (Signed)
No complaints. Doing well. Reports positive fetal movement.

## 2015-03-01 NOTE — Patient Instructions (Signed)
Braxton Hicks Contractions °Contractions of the uterus can occur throughout pregnancy. Contractions are not always a sign that you are in labor.  °WHAT ARE BRAXTON HICKS CONTRACTIONS?  °Contractions that occur before labor are called Braxton Hicks contractions, or false labor. Toward the end of pregnancy (32-34 weeks), these contractions can develop more often and may become more forceful. This is not true labor because these contractions do not result in opening (dilatation) and thinning of the cervix. They are sometimes difficult to tell apart from true labor because these contractions can be forceful and people have different pain tolerances. You should not feel embarrassed if you go to the hospital with false labor. Sometimes, the only way to tell if you are in true labor is for your health care provider to look for changes in the cervix. °If there are no prenatal problems or other health problems associated with the pregnancy, it is completely safe to be sent home with false labor and await the onset of true labor. °HOW CAN YOU TELL THE DIFFERENCE BETWEEN TRUE AND FALSE LABOR? °False Labor °· The contractions of false labor are usually shorter and not as hard as those of true labor.   °· The contractions are usually irregular.   °· The contractions are often felt in the front of the lower abdomen and in the groin.   °· The contractions may go away when you walk around or change positions while lying down.   °· The contractions get weaker and are shorter lasting as time goes on.   °· The contractions do not usually become progressively stronger, regular, and closer together as with true labor.   °True Labor °· Contractions in true labor last 30-70 seconds, become very regular, usually become more intense, and increase in frequency.   °· The contractions do not go away with walking.   °· The discomfort is usually felt in the top of the uterus and spreads to the lower abdomen and low back.   °· True labor can be  determined by your health care provider with an exam. This will show that the cervix is dilating and getting thinner.   °WHAT TO REMEMBER °· Keep up with your usual exercises and follow other instructions given by your health care provider.   °· Take medicines as directed by your health care provider.   °· Keep your regular prenatal appointments.   °· Eat and drink lightly if you think you are going into labor.   °· If Braxton Hicks contractions are making you uncomfortable:   °¨ Change your position from lying down or resting to walking, or from walking to resting.   °¨ Sit and rest in a tub of warm water.   °¨ Drink 2-3 glasses of water. Dehydration may cause these contractions.   °¨ Do slow and deep breathing several times an hour.   °WHEN SHOULD I SEEK IMMEDIATE MEDICAL CARE? °Seek immediate medical care if: °· Your contractions become stronger, more regular, and closer together.   °· You have fluid leaking or gushing from your vagina.   °· You have a fever.   °· You pass blood-tinged mucus.   °· You have vaginal bleeding.   °· You have continuous abdominal pain.   °· You have low back pain that you never had before.   °· You feel your baby's head pushing down and causing pelvic pressure.   °· Your baby is not moving as much as it used to.   °Document Released: 12/02/2005 Document Revised: 12/07/2013 Document Reviewed: 09/13/2013 °ExitCare® Patient Information ©2015 [employer]. This information is not intended to replace advice given to you by your health care   provider. Make sure you discuss any questions you have with your health care provider. ° °

## 2015-03-08 ENCOUNTER — Ambulatory Visit (INDEPENDENT_AMBULATORY_CARE_PROVIDER_SITE_OTHER): Payer: Medicaid Other | Admitting: Certified Nurse Midwife

## 2015-03-08 VITALS — BP 97/64 | HR 85 | Wt 150.0 lb

## 2015-03-08 DIAGNOSIS — Z3403 Encounter for supervision of normal first pregnancy, third trimester: Secondary | ICD-10-CM

## 2015-03-08 NOTE — Patient Instructions (Signed)
Braxton Hicks Contractions °Contractions of the uterus can occur throughout pregnancy. Contractions are not always a sign that you are in labor.  °WHAT ARE BRAXTON HICKS CONTRACTIONS?  °Contractions that occur before labor are called Braxton Hicks contractions, or false labor. Toward the end of pregnancy (32-34 weeks), these contractions can develop more often and may become more forceful. This is not true labor because these contractions do not result in opening (dilatation) and thinning of the cervix. They are sometimes difficult to tell apart from true labor because these contractions can be forceful and people have different pain tolerances. You should not feel embarrassed if you go to the hospital with false labor. Sometimes, the only way to tell if you are in true labor is for your health care provider to look for changes in the cervix. °If there are no prenatal problems or other health problems associated with the pregnancy, it is completely safe to be sent home with false labor and await the onset of true labor. °HOW CAN YOU TELL THE DIFFERENCE BETWEEN TRUE AND FALSE LABOR? °False Labor °· The contractions of false labor are usually shorter and not as hard as those of true labor.   °· The contractions are usually irregular.   °· The contractions are often felt in the front of the lower abdomen and in the groin.   °· The contractions may go away when you walk around or change positions while lying down.   °· The contractions get weaker and are shorter lasting as time goes on.   °· The contractions do not usually become progressively stronger, regular, and closer together as with true labor.   °True Labor °· Contractions in true labor last 30-70 seconds, become very regular, usually become more intense, and increase in frequency.   °· The contractions do not go away with walking.   °· The discomfort is usually felt in the top of the uterus and spreads to the lower abdomen and low back.   °· True labor can be  determined by your health care provider with an exam. This will show that the cervix is dilating and getting thinner.   °WHAT TO REMEMBER °· Keep up with your usual exercises and follow other instructions given by your health care provider.   °· Take medicines as directed by your health care provider.   °· Keep your regular prenatal appointments.   °· Eat and drink lightly if you think you are going into labor.   °· If Braxton Hicks contractions are making you uncomfortable:   °¨ Change your position from lying down or resting to walking, or from walking to resting.   °¨ Sit and rest in a tub of warm water.   °¨ Drink 2-3 glasses of water. Dehydration may cause these contractions.   °¨ Do slow and deep breathing several times an hour.   °WHEN SHOULD I SEEK IMMEDIATE MEDICAL CARE? °Seek immediate medical care if: °· Your contractions become stronger, more regular, and closer together.   °· You have fluid leaking or gushing from your vagina.   °· You have a fever.   °· You pass blood-tinged mucus.   °· You have vaginal bleeding.   °· You have continuous abdominal pain.   °· You have low back pain that you never had before.   °· You feel your baby's head pushing down and causing pelvic pressure.   °· Your baby is not moving as much as it used to.   °Document Released: 12/02/2005 Document Revised: 12/07/2013 Document Reviewed: 09/13/2013 °ExitCare® Patient Information ©2015 [employer]. This information is not intended to replace advice given to you by your health care   provider. Make sure you discuss any questions you have with your health care provider. ° °

## 2015-03-08 NOTE — Progress Notes (Signed)
Doing well. No complaints. Labor precautions discussed.

## 2015-03-10 ENCOUNTER — Inpatient Hospital Stay (HOSPITAL_COMMUNITY)
Admission: AD | Admit: 2015-03-10 | Discharge: 2015-03-11 | DRG: 775 | Disposition: A | Discharge status: Home / Self Care | Payer: Medicaid Other | Source: Ambulatory Visit | Attending: Family Medicine | Admitting: Family Medicine

## 2015-03-10 ENCOUNTER — Inpatient Hospital Stay (HOSPITAL_COMMUNITY): Payer: Medicaid Other | Admitting: Anesthesiology

## 2015-03-10 ENCOUNTER — Encounter (HOSPITAL_COMMUNITY): Payer: Self-pay | Admitting: *Deleted

## 2015-03-10 DIAGNOSIS — O9962 Diseases of the digestive system complicating childbirth: Secondary | ICD-10-CM | POA: Diagnosis present

## 2015-03-10 DIAGNOSIS — Z87891 Personal history of nicotine dependence: Secondary | ICD-10-CM | POA: Diagnosis not present

## 2015-03-10 DIAGNOSIS — Z3A39 39 weeks gestation of pregnancy: Secondary | ICD-10-CM | POA: Diagnosis not present

## 2015-03-10 DIAGNOSIS — K8020 Calculus of gallbladder without cholecystitis without obstruction: Secondary | ICD-10-CM | POA: Diagnosis present

## 2015-03-10 LAB — CBC
HCT: 28.8 % — ABNORMAL LOW (ref 36.0–46.0)
HEMOGLOBIN: 10.1 g/dL — AB (ref 12.0–15.0)
MCH: 33 pg (ref 26.0–34.0)
MCHC: 35.1 g/dL (ref 30.0–36.0)
MCV: 94.1 fL (ref 78.0–100.0)
Platelets: 189 10*3/uL (ref 150–400)
RBC: 3.06 MIL/uL — ABNORMAL LOW (ref 3.87–5.11)
RDW: 13.6 % (ref 11.5–15.5)
WBC: 8.8 10*3/uL (ref 4.0–10.5)

## 2015-03-10 LAB — TYPE AND SCREEN
ABO/RH(D): A POS
ANTIBODY SCREEN: NEGATIVE

## 2015-03-10 LAB — ABO/RH: ABO/RH(D): A POS

## 2015-03-10 LAB — RPR: RPR: NONREACTIVE

## 2015-03-10 MED ORDER — ONDANSETRON HCL 4 MG PO TABS: 4.0000 mg | ORAL_TABLET | ORAL | Status: DC | PRN

## 2015-03-10 MED ORDER — PRENATAL MULTIVITAMIN CH
1.0000 | ORAL_TABLET | Freq: Every day | ORAL | Status: DC
  Administered 2015-03-11: 1 via ORAL
  Filled 2015-03-10: qty 1

## 2015-03-10 MED ORDER — LACTATED RINGERS IV SOLN: 500.0000 mL | INTRAVENOUS | Status: DC | PRN

## 2015-03-10 MED ORDER — EPHEDRINE 5 MG/ML INJ: 10.0000 mg | INTRAVENOUS | Status: DC | PRN

## 2015-03-10 MED ORDER — PHENYLEPHRINE 40 MCG/ML (10ML) SYRINGE FOR IV PUSH (FOR BLOOD PRESSURE SUPPORT)
PREFILLED_SYRINGE | INTRAVENOUS | Status: AC
  Filled 2015-03-10: qty 20

## 2015-03-10 MED ORDER — LIDOCAINE HCL (PF) 1 % IJ SOLN
30.0000 mL | INTRAMUSCULAR | Status: DC | PRN
  Filled 2015-03-10: qty 30

## 2015-03-10 MED ORDER — OXYTOCIN BOLUS FROM INFUSION
500.0000 mL | INTRAVENOUS | Status: DC
  Administered 2015-03-10: 500 mL via INTRAVENOUS

## 2015-03-10 MED ORDER — TETANUS-DIPHTH-ACELL PERTUSSIS 5-2.5-18.5 LF-MCG/0.5 IM SUSP: 0.5000 mL | Freq: Once | INTRAMUSCULAR | Status: DC

## 2015-03-10 MED ORDER — ONDANSETRON HCL 4 MG/2ML IJ SOLN: 4.0000 mg | INTRAMUSCULAR | Status: DC | PRN

## 2015-03-10 MED ORDER — PHENYLEPHRINE 40 MCG/ML (10ML) SYRINGE FOR IV PUSH (FOR BLOOD PRESSURE SUPPORT): 80.0000 ug | PREFILLED_SYRINGE | INTRAVENOUS | Status: DC | PRN

## 2015-03-10 MED ORDER — LIDOCAINE HCL (PF) 1 % IJ SOLN
INTRAMUSCULAR | Status: DC | PRN
  Administered 2015-03-10 (×2): 4 mL

## 2015-03-10 MED ORDER — OXYCODONE-ACETAMINOPHEN 5-325 MG PO TABS: 2.0000 | ORAL_TABLET | ORAL | Status: DC | PRN

## 2015-03-10 MED ORDER — OXYTOCIN 40 UNITS IN LACTATED RINGERS INFUSION - SIMPLE MED
62.5000 mL/h | INTRAVENOUS | Status: DC
  Filled 2015-03-10: qty 1000

## 2015-03-10 MED ORDER — ZOLPIDEM TARTRATE 5 MG PO TABS: 5.0000 mg | ORAL_TABLET | Freq: Every evening | ORAL | Status: DC | PRN

## 2015-03-10 MED ORDER — LANOLIN HYDROUS EX OINT: TOPICAL_OINTMENT | CUTANEOUS | Status: DC | PRN

## 2015-03-10 MED ORDER — WITCH HAZEL-GLYCERIN EX PADS: 1.0000 "application " | MEDICATED_PAD | CUTANEOUS | Status: DC | PRN

## 2015-03-10 MED ORDER — SENNOSIDES-DOCUSATE SODIUM 8.6-50 MG PO TABS
2.0000 | ORAL_TABLET | ORAL | Status: DC
  Administered 2015-03-11: 2 via ORAL
  Filled 2015-03-10: qty 2

## 2015-03-10 MED ORDER — ACETAMINOPHEN 325 MG PO TABS: 650.0000 mg | ORAL_TABLET | ORAL | Status: DC | PRN

## 2015-03-10 MED ORDER — LACTATED RINGERS IV SOLN
500.0000 mL | Freq: Once | INTRAVENOUS | Status: AC
  Administered 2015-03-10: 500 mL via INTRAVENOUS

## 2015-03-10 MED ORDER — FLEET ENEMA 7-19 GM/118ML RE ENEM: 1.0000 | ENEMA | RECTAL | Status: DC | PRN

## 2015-03-10 MED ORDER — DIPHENHYDRAMINE HCL 50 MG/ML IJ SOLN: 12.5000 mg | INTRAMUSCULAR | Status: DC | PRN

## 2015-03-10 MED ORDER — SIMETHICONE 80 MG PO CHEW: 80.0000 mg | CHEWABLE_TABLET | ORAL | Status: DC | PRN

## 2015-03-10 MED ORDER — OXYCODONE-ACETAMINOPHEN 5-325 MG PO TABS: 1.0000 | ORAL_TABLET | ORAL | Status: DC | PRN

## 2015-03-10 MED ORDER — ONDANSETRON HCL 4 MG/2ML IJ SOLN: 4.0000 mg | Freq: Four times a day (QID) | INTRAMUSCULAR | Status: DC | PRN

## 2015-03-10 MED ORDER — CITRIC ACID-SODIUM CITRATE 334-500 MG/5ML PO SOLN: 30.0000 mL | ORAL | Status: DC | PRN

## 2015-03-10 MED ORDER — LACTATED RINGERS IV SOLN
INTRAVENOUS | Status: DC
  Administered 2015-03-10: 09:00:00 via INTRAVENOUS

## 2015-03-10 MED ORDER — DIBUCAINE 1 % RE OINT: 1.0000 "application " | TOPICAL_OINTMENT | RECTAL | Status: DC | PRN

## 2015-03-10 MED ORDER — DIPHENHYDRAMINE HCL 25 MG PO CAPS: 25.0000 mg | ORAL_CAPSULE | Freq: Four times a day (QID) | ORAL | Status: DC | PRN

## 2015-03-10 MED ORDER — FENTANYL 2.5 MCG/ML BUPIVACAINE 1/10 % EPIDURAL INFUSION (WH - ANES)
INTRAMUSCULAR | Status: DC | PRN
  Administered 2015-03-10: 12 mL/h via EPIDURAL

## 2015-03-10 MED ORDER — OXYCODONE-ACETAMINOPHEN 5-325 MG PO TABS
1.0000 | ORAL_TABLET | ORAL | Status: DC | PRN
  Administered 2015-03-11 (×2): 1 via ORAL
  Filled 2015-03-10 (×2): qty 1

## 2015-03-10 MED ORDER — BENZOCAINE-MENTHOL 20-0.5 % EX AERO
1.0000 "application " | INHALATION_SPRAY | CUTANEOUS | Status: DC | PRN
  Administered 2015-03-11: 1 via TOPICAL
  Filled 2015-03-10: qty 56

## 2015-03-10 MED ORDER — FENTANYL 2.5 MCG/ML BUPIVACAINE 1/10 % EPIDURAL INFUSION (WH - ANES)
14.0000 mL/h | INTRAMUSCULAR | Status: DC | PRN
  Administered 2015-03-10: 12.5 mL/h via EPIDURAL
  Administered 2015-03-10: 14 mL/h via EPIDURAL
  Filled 2015-03-10: qty 125

## 2015-03-10 MED ORDER — FENTANYL 2.5 MCG/ML BUPIVACAINE 1/10 % EPIDURAL INFUSION (WH - ANES)
INTRAMUSCULAR | Status: AC
  Filled 2015-03-10: qty 125

## 2015-03-10 MED ORDER — IBUPROFEN 600 MG PO TABS
600.0000 mg | ORAL_TABLET | Freq: Four times a day (QID) | ORAL | Status: DC
  Administered 2015-03-10 – 2015-03-11 (×5): 600 mg via ORAL
  Filled 2015-03-10 (×5): qty 1

## 2015-03-10 NOTE — H&P (Signed)
Toni Romero is a 24 y.o. G2P0010 female at 2028w3d by LMP presenting in active labor.   Reports active fetal movement, contractions: regular, every 1-3 minutes, vaginal bleeding: none, membranes: intact. [facility]   Most recent u/s 11/2014. Pregnancy complicated by gall stones/pancreatits with lap chole 08/2014.  Past Medical History: Past Medical History  Diagnosis Date  . Medical history non-contributory     Past Surgical History: Past Surgical History  Procedure Laterality Date  . No past surgeries    . Cholecystectomy N/A 09/09/2014    Procedure: LAPAROSCOPIC CHOLECYSTECTOMY WITH INTRAOPERATIVE CHOLANGIOGRAM;  Surgeon: Manus RuddMatthew Tsuei, MD;  Location: [facility] OR;  Service: General;  Laterality: N/A;    Obstetrical History: OB History    Gravida Para Term Preterm AB TAB SAB Ectopic Multiple Living   2    1  1          Social History: History   Social History  . Marital Status: Single    Spouse Name: N/A  . Number of Children: N/A  . Years of Education: N/A   Social History Main Topics  . Smoking status: Never Smoker   . Smokeless tobacco: Former NeurosurgeonUser    Quit date: 06/05/2014  . Alcohol Use: No  . Drug Use: No  . Sexual Activity: Yes    Birth Control/ Protection: None   Other Topics Concern  . None   Social History Narrative    Family History: Family History  Problem Relation Age of Onset  . Healthy Mother   . Healthy Father     Allergies: No Known Allergies  Prescriptions prior to admission  Medication Sig Dispense Refill Last Dose  . Prenatal Vit-Fe Fumarate-FA (PRENATAL MULTIVITAMIN) TABS tablet Take 1 tablet by mouth daily. 30 tablet 5 Past Month at Unknown time     Review of Systems  Pertinent pos/neg as indicated in HPI    Blood pressure 119/63, pulse 83, temperature 98.4 F (36.9 C), temperature source Oral, resp. rate 18, height 5\' 3"  (1.6 m), weight 69.854 kg (154 lb), last menstrual period 06/07/2014, SpO2 100 %,  unknown if currently breastfeeding. General appearance: alert, cooperative and moderate distress Lungs: clear to auscultation bilaterally Heart: regular rate and rhythm Abdomen: gravid, soft, non-tender Extremities: no edema   Fetal monitoring: FHR: 126 bpm, variability: moderate,  Accelerations: Present,  decelerations:  Absent Uterine activity: regular, every 1-3  Dilation: 4 Effacement (%): 90 Station: -2 Exam by:: Duncan DullWeston,RN Presentation: cephalic   Prenatal labs: ABO, Rh: --/--/A POS, A POS (09/23 1228) Antibody: NEG (09/23 1228) Rubella:   RPR: NON REAC (12/29 1038)  HBsAg: NEGATIVE (08/07 0929)  HIV: NONREACTIVE (12/29 1038)  GBS: Negative (03/02 0000)   1 hr Glucola: 93 Genetic screening:  NT nl, AFP nl Anatomy US: normal, female  Results for orders placed or performed during the hospital encounter of 03/10/15 (from the past 24 hour(s))  CBC   Collection Time: 03/10/15  5:25 AM  Result Value Ref Range   WBC 8.8 4.0 - 10.5 K/uL   RBC 3.06 (L) 3.87 - 5.11 MIL/uL   Hemoglobin 10.1 (L) 12.0 - 15.0 g/dL   HCT 78.228.8 (L) 95.636.0 - 21.346.0 %   MCV 94.1 78.0 - 100.0 fL   MCH 33.0 26.0 - 34.0 pg   MCHC 35.1 30.0 - 36.0 g/dL   RDW 08.613.6 57.811.5 - 46.915.5 %   Platelets 189 150 - 400 K/uL     Assessment:  8528w3d SIUP  G2P0010  Active labor  Cat 1 FHR  GBS Negative (03/02 0000)  Plan:  Admit to [facility]  IV pain meds/epidural prn active labor  Anticipate NSVD   Plans to breastfeed  Contraception: considering Mirena  Circumcision: n/a (girl)  CRESENZO-DISHMAN,Bryton Waight  03/10/2015, 6:12 AM

## 2015-03-10 NOTE — Anesthesia Postprocedure Evaluation (Signed)
  Anesthesia Post-op Note  Patient: Toni Romero  Procedure(s) Performed: * No procedures listed *  Patient Location: PACU and Mother/Baby  Anesthesia Type:Epidural  Level of Consciousness: awake, alert  and oriented  Airway and Oxygen Therapy: Patient Spontanous Breathing  Post-op Pain: mild  Post-op Assessment: Post-op Vital signs reviewed, Patient's Cardiovascular Status Stable, Respiratory Function Stable, No signs of Nausea or vomiting, Adequate PO intake, Pain level controlled, No headache, No backache, No residual numbness and No residual motor weakness  Post-op Vital Signs: Reviewed and stable  Last Vitals:  Filed Vitals:   03/10/15 1600  BP: 111/63  Pulse: 80  Temp: 36.9 C  Resp: 18    Complications: No apparent anesthesia complications

## 2015-03-10 NOTE — MAU Note (Signed)
Pt reports contractions that are worsening.

## 2015-03-10 NOTE — Anesthesia Procedure Notes (Signed)
Epidural Patient location during procedure: OB Start time: 03/10/2015 5:55 AM  Staffing Anesthesiologist: Karie SchwalbeJUDD, Kayzen Kendzierski Performed by: anesthesiologist   Preanesthetic Checklist Completed: patient identified, site marked, surgical consent, pre-op evaluation, timeout performed, IV checked, risks and benefits discussed and monitors and equipment checked  Epidural Patient position: sitting Prep: site prepped and draped and DuraPrep Patient monitoring: continuous pulse ox and blood pressure Approach: midline Location: L3-L4 Injection technique: LOR saline  Needle:  Needle type: Tuohy  Needle gauge: 17 G Needle length: 9 cm and 9 Needle insertion depth: 5 cm cm Catheter type: closed end flexible Catheter size: 19 Gauge Catheter at skin depth: 10 cm Test dose: negative  Assessment Events: blood not aspirated, injection not painful, no injection resistance, negative IV test and no paresthesia  Additional Notes Patient identified. Risks/Benefits/Options discussed with patient including but not limited to bleeding, infection, nerve damage, paralysis, failed block, incomplete pain control, headache, blood pressure changes, nausea, vomiting, reactions to medication both or allergic, itching and postpartum back pain. Confirmed with bedside nurse the patient's most recent platelet count. Confirmed with patient that they are not currently taking any anticoagulation, have any bleeding history or any family history of bleeding disorders. Patient expressed understanding and wished to proceed. All questions were answered. Sterile technique was used throughout the entire procedure. Please see nursing notes for vital signs. Test dose was given through epidural catheter and negative prior to continuing to dose epidural or start infusion. Warning signs of high block given to the patient including shortness of breath, tingling/numbness in hands, complete motor block, or any concerning symptoms with instructions  to call for help. Patient was given instructions on fall risk and not to get out of bed. All questions and concerns addressed with instructions to call with any issues or inadequate analgesia.

## 2015-03-10 NOTE — Progress Notes (Signed)
LABOR PROGRESS NOTE  Toni Romero is a 24 y.o. G2P0010 at 7067w3d  admitted for spontaneous onset of labor  Subjective: Resting comfortably with epidural in place, bowel movement, but feeling no additional pressure  Objective: BP 101/57 mmHg  Pulse 83  Temp(Src) 98 F (36.7 C) (Oral)  Resp 18  Ht 5\' 3"  (1.6 m)  Wt 69.854 kg (154 lb)  BMI 27.29 kg/m2  SpO2 100%  LMP 06/07/2014 or  Filed Vitals:   03/10/15 1201 03/10/15 1231 03/10/15 1302 03/10/15 1304  BP: 93/41 94/48 77/52  101/57  Pulse: 78 73 104 83  Temp:      TempSrc:      Resp: 16 16 18 18   Height:      Weight:      SpO2:        Alert and oriented x 3, breathing comfortably.   Dilation: 10 Dilation Complete Date: 03/10/15 Dilation Complete Time: 1316 Effacement (%): 100 Station: +3 Presentation: Vertex Exam by:: Dr. Suezanne JacquetHenson  Labs: Lab Results  Component Value Date   WBC 8.8 03/10/2015   HGB 10.1* 03/10/2015   HCT 28.8* 03/10/2015   MCV 94.1 03/10/2015   PLT 189 03/10/2015    Patient Active Problem List   Diagnosis Date Noted  . Normal labor 03/10/2015  . Anemia 09/07/2014  . Pancreatitis 09/05/2014  . Nausea and vomiting during pregnancy 09/05/2014  . Elevated LFTs 09/05/2014  . Hypokalemia 09/05/2014  . Gallbladder sludge 09/05/2014  . Weight loss, unintentional 09/05/2014  . UTI (urinary tract infection) 09/05/2014  . Supervision of low-risk pregnancy 07/22/2014    Assessment / Plan: 24 y.o. G2P0010 at 2867w3d here for spontaneous onset of labor  UC: every 1-3 minutes FHT: 135 bpm, mod variability, accels, occasional variable decels  Labor: Progressing as expected; now complete but not feeling pressure, anticipate pushing shortly Fetal Wellbeing: Cat II Pain Control: Epidural Anticipated MOD: NSVD  [name], MD 03/10/2015, 1:36 PM

## 2015-03-10 NOTE — Progress Notes (Signed)
Toni BusmanKhaliah Romero is a 24 y.o. G2P0010 at 4956w3d admitted for active labor  Subjective: Comfortable with epidural.  Objective: BP 99/66 mmHg  Pulse 84  Temp(Src) 98.5 F (36.9 C) (Oral)  Resp 18  Ht 5\' 3"  (1.6 m)  Wt 69.854 kg (154 lb)  BMI 27.29 kg/m2  SpO2 100%  LMP 06/07/2014    FHT:  FHR: 122 bpm, variability: moderate,  accelerations:  Present,  decelerations:  Absent UC:   regular, every 1-3 minutes  SVE:   Dilation: 8 Effacement (%): 90 Station: -1 Exam by:: Lynnea MaizesLisa Hever Castilleja, SCNM  Labs: Lab Results  Component Value Date   WBC 8.8 03/10/2015   HGB 10.1* 03/10/2015   HCT 28.8* 03/10/2015   MCV 94.1 03/10/2015   PLT 189 03/10/2015    Assessment / Plan: Spontaneous labor, progressing normally  Labor: Progressing normally Fetal Wellbeing:  Category I Pain Control:  Epidural Pre-eclampsia: no s/s I/D:  GBS negative Anticipated MOD:  NSVD  Shae Hinnenkamp Wynne SNM 03/10/2015, 8:04 AM

## 2015-03-10 NOTE — Lactation Note (Signed)
This note was copied from the chart of [name]. Lactation Consultation Note  Patient Name: [name]: 03/10/2015 Reason for consult: Initial assessment of this mom and baby at 3 hours pp. Mom is a primipara and is excited to report that her baby is nursing well and she attended prenatal BF class.  Mom says she was shown hand expression but LC reviewed technique, encouraged frequent STS and cue feedings and discussed signs of proper latch and milk transfer, as well as how baby's output will tell her if baby is getting enough milk.  Mom says she has a personal pump and wants to know when she can begin pumping.  LC discussed reasons for pumping and recommends exclusive nursing unless there are problems with baby latching.  Pumping and offering occasional bottle after 2 weeks is the ideal.  FOB is holding baby STS and mom is eating supper.  LC encouraged her to call for breastfeeding assistance as needed. Mom encouraged to feed baby 8-12 times/24 hours and with feeding cues. LC encouraged review of Baby and Me pp 9, 14 and 20-25 for STS and BF information. LC provided [employer] Resource brochure and reviewed [facility] and list of community and web site resources.    Maternal Data Formula Feeding for Exclusion: No Has patient been taught Hand Expression?: Yes (learned in prenatal BF class and LC reviewed technique) Does the patient have breastfeeding experience prior to this delivery?: No  Feeding Feeding Type: Breast Fed Length of feed: 15 min  LATCH Score/Interventions Latch: Repeated attempts needed to sustain latch, nipple held in mouth throughout feeding, stimulation needed to elicit sucking reflex. Intervention(s): Adjust position;Assist with latch  Audible Swallowing: Spontaneous and intermittent  Type of Nipple: Everted at rest and after stimulation  Comfort (Breast/Nipple): Soft / non-tender     Hold (Positioning): Assistance needed to correctly  position infant at breast and maintain latch. Intervention(s): Breastfeeding basics reviewed;Support Pillows;Position options;Skin to skin  LATCH Score: 8 (initial feeding assessment, per RN)  Lactation Tools Discussed/Used   STS, cue feedings, hand expression Signs of proper latch and milk transfer Pumping and storing recommended, if possible, after first 2 weeks  Consult Status Consult Status: Follow-up Date: 03/11/15 Follow-up type: In-patient    Warrick ParisianBryant, Raymonde Hamblin [facility] 03/10/2015, 5:31 PM

## 2015-03-10 NOTE — Anesthesia Preprocedure Evaluation (Signed)
Anesthesia Evaluation  Patient identified by MRN, date of birth, ID band Patient awake    Reviewed: Allergy & Precautions, NPO status , Patient's Chart, lab work & pertinent test results  History of Anesthesia Complications Negative for: history of anesthetic complications  Airway Mallampati: II  TM Distance: >3 FB Neck ROM: Full    Dental no notable dental hx. (+) Dental Advisory Given   Pulmonary neg pulmonary ROS,  breath sounds clear to auscultation  Pulmonary exam normal       Cardiovascular negative cardio ROS  Rhythm:Regular Rate:Normal     Neuro/Psych negative neurological ROS  negative psych ROS   GI/Hepatic negative GI ROS, Neg liver ROS,   Endo/Other  negative endocrine ROS  Renal/GU negative Renal ROS  negative genitourinary   Musculoskeletal negative musculoskeletal ROS (+)   Abdominal   Peds negative pediatric ROS (+)  Hematology  (+) anemia ,   Anesthesia Other Findings   Reproductive/Obstetrics (+) Pregnancy                             Anesthesia Physical Anesthesia Plan  ASA: II  Anesthesia Plan: Epidural   Post-op Pain Management:    Induction:   Airway Management Planned:   Additional Equipment:   Intra-op Plan:   Post-operative Plan:   Informed Consent: I have reviewed the patients History and Physical, chart, labs and discussed the procedure including the risks, benefits and alternatives for the proposed anesthesia with the patient or authorized representative who has indicated his/her understanding and acceptance.   Dental advisory given  Plan Discussed with:   Anesthesia Plan Comments:         Anesthesia Quick Evaluation

## 2015-03-10 NOTE — Progress Notes (Signed)
LABOR PROGRESS NOTE  Toni Romero is a 24 y.o. G2P0010 at 5560w3d  admitted for SOL.  Subjective: Resting comfortably with epidural in place  Objective: BP 111/61 mmHg  Pulse 85  Temp(Src) 98.2 F (36.8 C) (Oral)  Resp 20  Ht 5\' 3"  (1.6 m)  Wt 69.854 kg (154 lb)  BMI 27.29 kg/m2  SpO2 100%  LMP 06/07/2014 or  Filed Vitals:   03/10/15 0902 03/10/15 0931 03/10/15 1001 03/10/15 1032  BP: 104/61 89/67 104/70 111/61  Pulse: 79 77 77 85  Temp:      TempSrc:      Resp: 20 18 18 20   Height:      Weight:      SpO2:       Alert and oriented x 3, breathing comfortably.   Cervical exam: 9/100/+2  Dilation: 8 Effacement (%): 90 Station: -1 Presentation: Vertex Exam by:: Lynnea MaizesLisa Ross, SCNM  Labs: Lab Results  Component Value Date   WBC 8.8 03/10/2015   HGB 10.1* 03/10/2015   HCT 28.8* 03/10/2015   MCV 94.1 03/10/2015   PLT 189 03/10/2015    Patient Active Problem List   Diagnosis Date Noted  . Normal labor 03/10/2015  . Anemia 09/07/2014  . Pancreatitis 09/05/2014  . Nausea and vomiting during pregnancy 09/05/2014  . Elevated LFTs 09/05/2014  . Hypokalemia 09/05/2014  . Gallbladder sludge 09/05/2014  . Weight loss, unintentional 09/05/2014  . UTI (urinary tract infection) 09/05/2014  . Supervision of low-risk pregnancy 07/22/2014    Assessment / Plan: 24 y.o. G2P0010 at 5360w3d here for spontaneous onset of labor  UC: every 1-3 minutes FHT: 135 bpm, mod variability, accels, no decels  Labor: Progressing as expected.  aROM at 1045.   Fetal Wellbeing:  Cat I Pain Control:  Epidural Anticipated MOD:  NSVD  Darely Becknell, MD 03/10/2015, 10:46 AM

## 2015-03-11 LAB — CBC
HCT: 26.2 % — ABNORMAL LOW (ref 36.0–46.0)
HEMOGLOBIN: 9.1 g/dL — AB (ref 12.0–15.0)
MCH: 32.7 pg (ref 26.0–34.0)
MCHC: 34.7 g/dL (ref 30.0–36.0)
MCV: 94.2 fL (ref 78.0–100.0)
Platelets: 192 10*3/uL (ref 150–400)
RBC: 2.78 MIL/uL — AB (ref 3.87–5.11)
RDW: 13.6 % (ref 11.5–15.5)
WBC: 13.1 10*3/uL — ABNORMAL HIGH (ref 4.0–10.5)

## 2015-03-11 LAB — HIV ANTIBODY (ROUTINE TESTING W REFLEX): HIV Screen 4th Generation wRfx: NONREACTIVE

## 2015-03-11 MED ORDER — IBUPROFEN 600 MG PO TABS: 600.0000 mg | ORAL_TABLET | Freq: Four times a day (QID) | ORAL | Status: DC

## 2015-03-11 NOTE — Discharge Summary (Signed)
Obstetric Discharge Summary Reason for Admission: onset of labor Prenatal Procedures: none Intrapartum Procedures: spontaneous vaginal delivery Postpartum Procedures: none Complications-Operative and Postpartum: 1st degree sulcus/labial HEMOGLOBIN  Date Value Ref Range Status  03/11/2015 9.1* 12.0 - 15.0 g/dL Final   HCT  Date Value Ref Range Status  03/11/2015 26.2* 36.0 - 46.0 % Final   Toni Romero is a 23yo G2P0010 @ term [facility] She progressed to SVD by later that same afternoon. By PPD#1 she was deemed to have received the full benefit of her hospital stay and was discharged home per her request. She was breastfeeding and was considering Mirena for PP contraception.  Physical Exam:  General: alert, cooperative and no distress  Heart: RRR Lungs: nl effort Lochia: appropriate Uterine Fundus: firm DVT Evaluation: No evidence of DVT seen on physical exam.  Discharge Diagnoses: Term Pregnancy-delivered  Discharge Information: Date: 03/11/2015 Activity: pelvic rest Diet: routine Medications: PNV and Ibuprofen Condition: stable Instructions: refer to practice specific booklet Discharge to: home Follow-up Information    [facility] Schedule an appointment as soon as possible for a visit in 4 weeks.   Specialty:  Obstetrics and Gynecology   Why:  For your postpartum appointment.   Contact information:   [address] [place] [phone]      Newborn Data: Live born female  Birth Weight: 7 lb 3.3 oz (3270 g) APGAR: 9, 9  Home with mother.  Cam HaiSHAW, Toni Koerner CNM 03/11/2015, 9:08 AM

## 2015-03-11 NOTE — Discharge Instructions (Signed)

## 2015-03-12 ENCOUNTER — Ambulatory Visit: Payer: Self-pay

## 2015-03-12 NOTE — Lactation Note (Signed)
This note was copied from the chart of Toni Tressa BusmanKhaliah Apperson. Lactation Consultation Note  Follow up visit prior to discharge.  Mom states now that she can see her milk she feels more reassured.  She states baby is nursing well.  Reviewed basics and milk coming to volume.  Instructed on engorgement treatment.  Outpatient support and services encouraged prn.  Patient Name: Toni Romero ZOXWR'UToday's Date: 03/12/2015     Maternal Data    Feeding Length of feed: 25 min  LATCH Score/Interventions Latch: Repeated attempts needed to sustain latch, nipple held in mouth throughout feeding, stimulation needed to elicit sucking reflex.  Audible Swallowing: Spontaneous and intermittent  Type of Nipple: Everted at rest and after stimulation  Comfort (Breast/Nipple): Filling, red/small blisters or bruises, mild/mod discomfort  Problem noted: Mild/Moderate discomfort  Hold (Positioning): Assistance needed to correctly position infant at breast and maintain latch.  LATCH Score: 7  Lactation Tools Discussed/Used     Consult Status      Huston FoleyMOULDEN, Webber Michiels S 03/12/2015, 10:09 AM

## 2015-03-13 ENCOUNTER — Encounter (HOSPITAL_COMMUNITY): Payer: Self-pay | Admitting: *Deleted

## 2015-03-15 ENCOUNTER — Encounter: Payer: 59 | Admitting: Obstetrics & Gynecology

## 2015-03-20 NOTE — Progress Notes (Signed)
Post discharge chart review completed.  

## 2015-04-23 ENCOUNTER — Emergency Department (HOSPITAL_COMMUNITY)
Admission: EM | Admit: 2015-04-23 | Discharge: 2015-04-24 | Disposition: A | Discharge status: Home / Self Care | Payer: Medicaid Other | Attending: Emergency Medicine | Admitting: Emergency Medicine

## 2015-04-23 ENCOUNTER — Encounter (HOSPITAL_COMMUNITY): Payer: Self-pay | Admitting: *Deleted

## 2015-04-23 DIAGNOSIS — R51 Headache: Secondary | ICD-10-CM | POA: Diagnosis present

## 2015-04-23 DIAGNOSIS — Z872 Personal history of diseases of the skin and subcutaneous tissue: Secondary | ICD-10-CM | POA: Diagnosis not present

## 2015-04-23 DIAGNOSIS — R21 Rash and other nonspecific skin eruption: Secondary | ICD-10-CM | POA: Diagnosis not present

## 2015-04-23 DIAGNOSIS — R519 Headache, unspecified: Secondary | ICD-10-CM

## 2015-04-23 MED ORDER — DIPHENHYDRAMINE HCL 25 MG PO CAPS
50.0000 mg | ORAL_CAPSULE | Freq: Once | ORAL | Status: AC
  Administered 2015-04-23: 50 mg via ORAL
  Filled 2015-04-23: qty 2

## 2015-04-23 MED ORDER — IBUPROFEN 800 MG PO TABS
800.0000 mg | ORAL_TABLET | Freq: Once | ORAL | Status: AC
Start: 2015-04-23 — End: 2015-04-23
  Administered 2015-04-23: 800 mg via ORAL
  Filled 2015-04-23: qty 1

## 2015-04-23 MED ORDER — METOCLOPRAMIDE HCL 10 MG PO TABS
10.0000 mg | ORAL_TABLET | Freq: Once | ORAL | Status: AC
  Administered 2015-04-23: 10 mg via ORAL
  Filled 2015-04-23: qty 1

## 2015-04-23 NOTE — ED Provider Notes (Signed)
CSN: [id number]     Arrival date & time 04/23/15  2131 History   First MD Initiated Contact with Patient 04/23/15 2257    This chart was scribed for Tomasita CrumbleAdeleke Claire Bridge, MD by Marica OtterNusrat Rahman, ED Scribe. This patient was seen in room A01C/A01C and the patient's care was started at 11:02 PM.  Chief Complaint  Patient presents with  . Headache   The history is provided by the patient. No language interpreter was used.  PCP: No PCP Per Patient HPI Comments: Toni BusmanKhaliah Romero is a 24 y.o. female, with PMH noted below, who gave birth to her first child on 03/10/15, and Hx of chronic headaches, who presents to the Emergency Department complaining of gradual, atraumatic, constant, aching, worsening headache onset this morning. Pt rates her pain an 8 out of 10. Pt reports taking an ibuprofen at 2pm without relief. Pt denies weakness in the extremities, slurred speech, or blurred vision.   Pt, with a Hx of eczema, also complains of rashes in her inner thighs onset a few days ago. Pt denies any associated itching to the rash sites.   Past Medical History  Diagnosis Date  . Medical history non-contributory    Past Surgical History  Procedure Laterality Date  . No past surgeries    . Cholecystectomy N/A 09/09/2014    Procedure: LAPAROSCOPIC CHOLECYSTECTOMY WITH INTRAOPERATIVE CHOLANGIOGRAM;  Surgeon: Manus RuddMatthew Tsuei, MD;  Location: [facility] OR;  Service: General;  Laterality: N/A;   Family History  Problem Relation Age of Onset  . Healthy Mother   . Healthy Father    History  Substance Use Topics  . Smoking status: Never Smoker   . Smokeless tobacco: Former NeurosurgeonUser    Quit date: 06/05/2014  . Alcohol Use: No   OB History    Gravida Para Term Preterm AB TAB SAB Ectopic Multiple Living   2 1 1  1  1   0 1     Review of Systems  10 Systems reviewed and are negative for acute change except as noted in the HPI.   Allergies  Review of patient's allergies indicates no known allergies.  Home Medications   Prior  to Admission medications   Medication Sig Start Date End Date Taking? Authorizing Provider  ibuprofen (ADVIL,MOTRIN) 600 MG tablet Take 1 tablet (600 mg total) by mouth every 6 (six) hours. 03/11/15   Arabella MerlesKimberly D Shaw, CNM   Triage Vitals: BP 123/75 mmHg  Pulse 67  Temp(Src) 98.3 F (36.8 C)  Resp 18  Ht 5\' 4"  (1.626 m)  Wt 142 lb (64.411 kg)  BMI 24.36 kg/m2  SpO2 98% Physical Exam  Constitutional: She is oriented to person, place, and time. She appears well-developed and well-nourished. No distress.  HENT:  Head: Normocephalic and atraumatic.  Nose: Nose normal.  Mouth/Throat: Oropharynx is clear and moist. No oropharyngeal exudate.  Eyes: Conjunctivae and EOM are normal. Pupils are equal, round, and reactive to light. No scleral icterus.  Neck: Normal range of motion. Neck supple. No JVD present. No tracheal deviation present. No thyromegaly present.  Cardiovascular: Normal rate, regular rhythm and normal heart sounds.  Exam reveals no gallop and no friction rub.   No murmur heard. Pulmonary/Chest: Effort normal and breath sounds normal. No respiratory distress. She has no wheezes. She exhibits no tenderness.  Abdominal: Soft. Bowel sounds are normal. She exhibits no distension and no mass. There is no tenderness. There is no rebound and no guarding.  Musculoskeletal: Normal range of motion. She exhibits no edema  or tenderness.  Lymphadenopathy:    She has no cervical adenopathy.  Neurological: She is alert and oriented to person, place, and time. No cranial nerve deficit. She exhibits normal muscle tone.  Normal strength and sensation to all extremities and normal cerebral testing.  Skin: Skin is warm and dry. Rash noted. No erythema. No pallor.  Eczematous rash seen in bilateral inner thighs. Total of 3 lesions, the largest being 2 cm circumferential. There is no erythema or warmth.  Nursing note and vitals reviewed.   ED Course  Procedures (including critical care  time) DIAGNOSTIC STUDIES: Oxygen Saturation is 98% on RA, nl by my interpretation.    COORDINATION OF CARE: 11:08 PM-Discussed treatment plan which includes meds with pt at bedside and pt agreed to plan.   Labs Review Labs Reviewed - No data to display  Imaging Review No results found.   EKG Interpretation None      MDM   Final diagnoses:  None   Patient presents emergency department for headache. She states it is been going on for one day. She denies any neurological complaints. Her neurological exam here is completely normal. Patient was treated with supportive care with Motrin, Reglan, Benadryl for pain relief. Patient was advised on keeping her skin well hydrated and moist with lotions regarding her eczematous rash on her thighs.  Upon repeat evaluation the patient her headache has improved. We'll discharge with a short course of Reglan to take as needed. Primary care follow-up was advised within 3 days. Her vital signs were within her normal limits and she is safe for discharge.   I personally performed the services described in this documentation, which was scribed in my presence. The recorded information has been reviewed and is accurate.   Tomasita CrumbleAdeleke Keiyon Plack, MD 04/24/15 [phone]

## 2015-04-23 NOTE — ED Notes (Signed)
The pt has had a headache since this am no n v or diarrhea.  lmp just deivered

## 2015-04-23 NOTE — ED Notes (Signed)
Oni, MD at bedside.  

## 2015-04-24 MED ORDER — METOCLOPRAMIDE HCL 10 MG PO TABS: 10.0000 mg | ORAL_TABLET | Freq: Two times a day (BID) | ORAL | Status: DC | PRN

## 2015-04-24 NOTE — Discharge Instructions (Signed)
General Headache Without Cause Toni Romero, continue to take medication as prescribed as needed for your headache. See a primary care physician within 3 days for close follow-up. If any symptoms worsen come back to emergency department immediately. Thank you. A general headache is pain or discomfort felt around the head or neck area. The cause may not be found.  HOME CARE   Keep all doctor visits.  Only take medicines as told by your doctor.  Lie down in a dark, quiet room when you have a headache.  Keep a journal to find out if certain things bring on headaches. For example, write down:  What you eat and drink.  How much sleep you get.  Any change to your diet or medicines.  Relax by getting a massage or doing other relaxing activities.  Put ice or heat packs on the head and neck area as told by your doctor.  Lessen stress.  Sit up straight. Do not tighten (tense) your muscles.  Quit smoking if you smoke.  Lessen how much alcohol you drink.  Lessen how much caffeine you drink, or stop drinking caffeine.  Eat and sleep on a regular schedule.  Get 7 to 9 hours of sleep, or as told by your doctor.  Keep lights dim if bright lights bother you or make your headaches worse. GET HELP RIGHT AWAY IF:   Your headache becomes really bad.  You have a fever.  You have a stiff neck.  You have trouble seeing.  Your muscles are weak, or you lose muscle control.  You lose your balance or have trouble walking.  You feel like you will pass out (faint), or you pass out.  You have really bad symptoms that are different than your first symptoms.  You have problems with the medicines given to you by your doctor.  Your medicines do not work.  Your headache feels different than the other headaches.  You feel sick to your stomach (nauseous) or throw up (vomit). MAKE SURE YOU:   Understand these instructions.  Will watch your condition.  Will get help right away if you are  not doing well or get worse. Document Released: 09/10/2008 Document Revised: 02/24/2012 Document Reviewed: 11/22/2011 [facility] Patient Information 2015 [place] [place]. This information is not intended to replace advice given to you by your health care provider. Make sure you discuss any questions you have with your health care provider.

## 2015-04-25 ENCOUNTER — Ambulatory Visit: Payer: 59 | Admitting: Family Medicine

## 2015-05-04 ENCOUNTER — Encounter: Payer: Self-pay | Admitting: Obstetrics and Gynecology

## 2015-05-04 ENCOUNTER — Other Ambulatory Visit (HOSPITAL_COMMUNITY)
Admission: RE | Admit: 2015-05-04 | Discharge: 2015-05-04 | Disposition: A | Discharge status: Home / Self Care | Payer: Medicaid Other | Source: Ambulatory Visit | Attending: Obstetrics and Gynecology | Admitting: Obstetrics and Gynecology

## 2015-05-04 ENCOUNTER — Ambulatory Visit (INDEPENDENT_AMBULATORY_CARE_PROVIDER_SITE_OTHER): Payer: Medicaid Other | Admitting: Obstetrics and Gynecology

## 2015-05-04 VITALS — BP 111/70 | HR 71 | Wt 147.0 lb

## 2015-05-04 DIAGNOSIS — Z01812 Encounter for preprocedural laboratory examination: Secondary | ICD-10-CM

## 2015-05-04 DIAGNOSIS — Z30430 Encounter for insertion of intrauterine contraceptive device: Secondary | ICD-10-CM

## 2015-05-04 DIAGNOSIS — Z01419 Encounter for gynecological examination (general) (routine) without abnormal findings: Secondary | ICD-10-CM | POA: Diagnosis present

## 2015-05-04 DIAGNOSIS — Z30014 Encounter for initial prescription of intrauterine contraceptive device: Secondary | ICD-10-CM

## 2015-05-04 LAB — POCT URINE PREGNANCY: PREG TEST UR: NEGATIVE

## 2015-05-04 NOTE — Progress Notes (Signed)
  Subjective:     Toni Romero is a 24 y.o. female who presents for a postpartum visit. She is 8 weeks postpartum following a spontaneous vaginal delivery. I have fully reviewed the prenatal and intrapartum course. The delivery was at 39.3 gestational weeks. Outcome: spontaneous vaginal delivery. Anesthesia: epidural. Postpartum course has been uncomplicated. Baby's course has been uncomplicated. Baby is feeding by breast. Bleeding no bleeding. Bowel function is normal. Bladder function is normal. Patient is sexually active. Contraception method is condoms. Postpartum depression screening: negative.     Review of Systems Pertinent items are noted in HPI.   Objective:    There were no vitals taken for this visit.  General:  alert, cooperative and no distress   Breasts:  inspection negative, no nipple discharge or bleeding, no masses or nodularity palpable  Lungs: clear to auscultation bilaterally  Heart:  regular rate and rhythm  Abdomen: soft, non-tender; bowel sounds normal; no masses,  no organomegaly   Vulva:  normal  Vagina: normal vagina, no discharge, exudate, lesion, or erythema  Cervix:  multiparous appearance  Corpus: normal size, contour, position, consistency, mobility, non-tender  Adnexa:  normal adnexa and no mass, fullness, tenderness  Rectal Exam: Not performed.        Assessment:     Normal postpartum exam. Pap smear done at today's visit.   Plan:    1. Contraception: IUD 2. IUD Procedure Note Patient identified, informed consent performed, signed copy in chart, time out was performed.  Urine pregnancy test negative.  Speculum placed in the vagina.  Cervix visualized.  Cleaned with Betadine x 2.  Grasped anteriorly with a single tooth tenaculum.  Uterus sounded to 8 cm.  Mirena IUD placed per manufacturer's recommendations.  Strings trimmed to 3 cm. Tenaculum was removed, good hemostasis noted.  Patient tolerated procedure well.   Patient given post procedure  instructions and Mirena care card with expiration date.  Patient is asked to check IUD strings periodically and follow up in 4-6 weeks for IUD check.   3. Follow up in: 4 weeks for string check or as needed 4. Patient is medically cleared to resume all activities of daily living.

## 2015-05-08 LAB — CYTOLOGY - PAP

## 2015-05-08 MED ORDER — METRONIDAZOLE 500 MG PO TABS: 500.0000 mg | ORAL_TABLET | Freq: Two times a day (BID) | ORAL | Status: DC

## 2015-05-08 NOTE — Addendum Note (Signed)
Addended by: Catalina AntiguaONSTANT, Monchel Pollitt on: 05/08/2015 03:38 PM   Modules accepted: Orders

## 2015-05-11 ENCOUNTER — Telehealth: Payer: Self-pay | Admitting: *Deleted

## 2015-05-11 MED ORDER — METRONIDAZOLE 500 MG PO TABS: 500.0000 mg | ORAL_TABLET | Freq: Two times a day (BID) | ORAL | Status: AC

## 2015-05-11 NOTE — Telephone Encounter (Signed)
Patient called and states the pharmacy did not receive the order for her medication.  I resent the order to the [facility].

## 2015-05-11 NOTE — Telephone Encounter (Signed)
-----   Message from Catalina AntiguaPeggy Constant, MD sent at 05/08/2015  3:38 PM EDT ----- Please inform the patient of positive trich on pap smear. Flagyl e-prescribed. Her partner needs to be notified and treated. Abstinence for at least 7 days until both have completed treatment  Thanks  Peggy

## 2015-05-11 NOTE — Telephone Encounter (Signed)
I have made patient aware of results and need to pick up prescription for treatment.  Instructed patient no alcohol while taking antibiotic and partner needs to be treated.

## 2015-05-16 ENCOUNTER — Ambulatory Visit (INDEPENDENT_AMBULATORY_CARE_PROVIDER_SITE_OTHER): Payer: Medicaid Other | Admitting: Family Medicine

## 2015-05-16 ENCOUNTER — Ambulatory Visit: Payer: 59 | Admitting: Family Medicine

## 2015-05-16 ENCOUNTER — Encounter: Payer: Self-pay | Admitting: Family Medicine

## 2015-05-16 VITALS — BP 109/68 | HR 63 | Wt 137.0 lb

## 2015-05-16 DIAGNOSIS — R21 Rash and other nonspecific skin eruption: Secondary | ICD-10-CM

## 2015-05-16 DIAGNOSIS — Z30431 Encounter for routine checking of intrauterine contraceptive device: Secondary | ICD-10-CM

## 2015-05-16 NOTE — Progress Notes (Signed)
    Subjective:    Patient ID: Toni BusmanKhaliah Romero is a 24 y.o. female presenting with Contraception  on 05/16/2015  HPI: Had IUD placed 2 weeks ago. Partner states it feels like a needle sticking him.  Also has rash in the groin area.  Has been treating it with hydrocortisone but it is not improving and it is spreading. Has no animals at home and no one else is symptomatic.  Review of Systems  Constitutional: Negative for fever and chills.  Respiratory: Negative for shortness of breath.   Cardiovascular: Negative for chest pain.  Gastrointestinal: Negative for nausea, vomiting and abdominal pain.  Genitourinary: Negative for dysuria.  Skin: Negative for rash.      Objective:    BP 109/68 mmHg  Pulse 63  Wt 137 lb (62.143 kg)  Breastfeeding? Yes Physical Exam  Constitutional: She is oriented to person, place, and time. She appears well-developed and well-nourished. No distress.  HENT:  Head: Normocephalic and atraumatic.  Eyes: No scleral icterus.  Neck: Neck supple.  Cardiovascular: Normal rate.   Pulmonary/Chest: Effort normal.  Abdominal: Soft.  Genitourinary: Vagina normal and uterus normal.  Short IUD string 0.5 cm at cervical os--  Neurological: She is alert and oriented to person, place, and time.  Skin: Skin is warm and dry.  Psychiatric: She has a normal mood and affect.        Assessment & Plan:   Rash of genitalia - Given location, likely annual eczema--but since no improvement with topical steroids--using Dove, will add Lamisil. If still no improvement-send to Dermatology.  IUD check up - Strings tucked into cervix.    PRATT,TANYA S 05/16/2015 4:21 PM

## 2015-05-16 NOTE — Patient Instructions (Signed)

## 2015-06-01 ENCOUNTER — Ambulatory Visit: Payer: 59 | Admitting: Obstetrics & Gynecology

## 2015-06-08 ENCOUNTER — Ambulatory Visit (INDEPENDENT_AMBULATORY_CARE_PROVIDER_SITE_OTHER): Payer: Medicaid Other | Admitting: Obstetrics & Gynecology

## 2015-06-08 VITALS — BP 99/61 | HR 59 | Ht 64.0 in | Wt 145.0 lb

## 2015-06-08 DIAGNOSIS — Z30431 Encounter for routine checking of intrauterine contraceptive device: Secondary | ICD-10-CM | POA: Diagnosis not present

## 2015-06-08 DIAGNOSIS — A5901 Trichomonal vulvovaginitis: Secondary | ICD-10-CM

## 2015-06-08 DIAGNOSIS — A599 Trichomoniasis, unspecified: Secondary | ICD-10-CM

## 2015-06-08 NOTE — Progress Notes (Signed)
Here today for IUD check, she would like the strings trimmed or tucked as partner doe feel them during intercourse.  Would like TOC for trich.

## 2015-06-08 NOTE — Progress Notes (Signed)
   Subjective:    Patient ID: Toni Romero, female    DOB: 12-Sep-1991, 24 y.o.   MRN: [id number]  HPI  She is here for a string check. It is still poking her partner's penis. She would also like a wet prep for a check on the h/o trich. She has a small amount of irregular spotting with the Mirena.  Review of Systems     Objective:   Physical Exam  Her  0.5 cm strings were cut flush with the external os. There was no vaginal discharge but I swabbed the vagina for a wet prep.      Assessment & Plan:  String check- RTC 1 year/prn sooner

## 2015-06-09 LAB — WET PREP, GENITAL
Trich, Wet Prep: NONE SEEN
YEAST WET PREP: NONE SEEN

## 2015-06-28 ENCOUNTER — Ambulatory Visit (INDEPENDENT_AMBULATORY_CARE_PROVIDER_SITE_OTHER): Payer: Medicaid Other | Admitting: Obstetrics and Gynecology

## 2015-06-28 ENCOUNTER — Encounter: Payer: Self-pay | Admitting: Obstetrics and Gynecology

## 2015-06-28 VITALS — BP 110/68 | HR 79 | Wt 145.0 lb

## 2015-06-28 DIAGNOSIS — R21 Rash and other nonspecific skin eruption: Secondary | ICD-10-CM | POA: Diagnosis not present

## 2015-06-28 DIAGNOSIS — Z30431 Encounter for routine checking of intrauterine contraceptive device: Secondary | ICD-10-CM

## 2015-06-28 NOTE — Progress Notes (Signed)
Patient ID: Toni BusmanKhaliah Romero, female   DOB: 12/10/1991, 24 y.o.   MRN: [id number] 23 yp G2P1011 presenting today for IUD check and evaluation of a skin rash. Patient reports that her partner is still able to feel the IUD strings. She denies any vaginal bleeding and is otherwise quite content with this method of birth control. Patient also reports the presence of a rash on the back of her upper thighs. This rash has been present for years and she has treated it with cortisone cream and now lamisol cream without any improvement. The rash is pruritic at times.  Past Medical History  Diagnosis Date  . Medical history non-contributory    Past Surgical History  Procedure Laterality Date  . No past surgeries    . Cholecystectomy N/A 09/09/2014    Procedure: LAPAROSCOPIC CHOLECYSTECTOMY WITH INTRAOPERATIVE CHOLANGIOGRAM;  Surgeon: Manus RuddMatthew Tsuei, MD;  Location: [facility] OR;  Service: General;  Laterality: N/A;   Family History  Problem Relation Age of Onset  . Healthy Mother   . Healthy Father    History  Substance Use Topics  . Smoking status: Never Smoker   . Smokeless tobacco: Former NeurosurgeonUser    Quit date: 06/05/2014  . Alcohol Use: No   ROS See pertinent in HIP  Blood pressure 110/68, pulse 79, weight 145 lb (65.772 kg), currently breastfeeding.  GENERAL: Well-developed, well-nourished female in no acute distress.  ABDOMEN: Soft, nontender, nondistended. No organomegaly. PELVIC: Normal external female genitalia. Vagina is pink and rugated.  Normal discharge. Normal appearing cervix. IUD strings not visualized at the os. Uterus is normal in size. No adnexal mass or tenderness. EXTREMITIES: No cyanosis, clubbing, or edema, 2+ distal pulses. Numerous raised plaques of scaly skin ranging in size from 1 cm to 5 cm located on the posterior and inner aspect of upper thigs  A/P 24 yo here for IUD check and evaluation of rash - Reassurance provided regarding IUD. IUD strings not visualized and should no  longer be an issue during intercourse - Will refer patient to dermatology for skin rash. Encouraged her to continue using cortisone cream on rash

## 2015-07-14 ENCOUNTER — Encounter: Payer: Self-pay | Admitting: Obstetrics & Gynecology

## 2015-07-14 ENCOUNTER — Ambulatory Visit (INDEPENDENT_AMBULATORY_CARE_PROVIDER_SITE_OTHER): Payer: Medicaid Other | Admitting: Obstetrics & Gynecology

## 2015-07-14 VITALS — BP 111/71 | HR 59 | Resp 16 | Ht 64.0 in | Wt 143.0 lb

## 2015-07-14 DIAGNOSIS — T8389XA Other specified complication of genitourinary prosthetic devices, implants and grafts, initial encounter: Secondary | ICD-10-CM | POA: Diagnosis not present

## 2015-07-14 DIAGNOSIS — Z30018 Encounter for initial prescription of other contraceptives: Secondary | ICD-10-CM | POA: Diagnosis not present

## 2015-07-14 DIAGNOSIS — T8389XD Other specified complication of genitourinary prosthetic devices, implants and grafts, subsequent encounter: Secondary | ICD-10-CM

## 2015-07-14 NOTE — Progress Notes (Signed)
   Subjective:    Patient ID: Toni Romero, female    DOB: 04/11/1991, 24 y.o.   MRN: [id number]  HPI  This adorable 24 yo AA P1 is here because her partner still feels the strings with sex.   Review of Systems     Objective:   Physical Exam  WNWHBFNAD The strings were not visible so I used the IUD hook to easily remove the intact Mirena.  UPT was negative. Consent was signed. Time out procedure was done. Her left arm was prepped with betadine and infiltrated with 3 cc of 1% lidocaine. After adequate anesthesia was assured, the Nexplanon device was placed according to standard of care. Her arm was hemostatic and was bandaged. She tolerated the procedure well.       Assessment & Plan:   Contraception- Nexplanon RTC prn

## 2016-03-28 IMAGING — MR MR MRCP
7 of 12 series · 22 of 48 positions shown · non-contrast
Comparison: None.

CLINICAL DATA: Pregnant patient with nausea, vomiting and
right-sided chest pain. Shortness of breath. Right upper quadrant
abdominal pain. Elevated liver function tests and elevated lipase.

EXAM:
MRI ABDOMEN WITHOUT CONTRAST  (INCLUDING MRCP)
TECHNIQUE: Multiplanar multisequence MR imaging of the abdomen was performed.
Heavily T2-weighted images of the biliary and pancreatic ducts were
obtained, and three-dimensional MRCP images were rendered by post
processing.

[Series 4: T2 · axial · 5.0mm · 0.70mm/px · z∈[-68,+127]mm · 2 of 40 slices shown (1 of 2)]
[im 1/40]
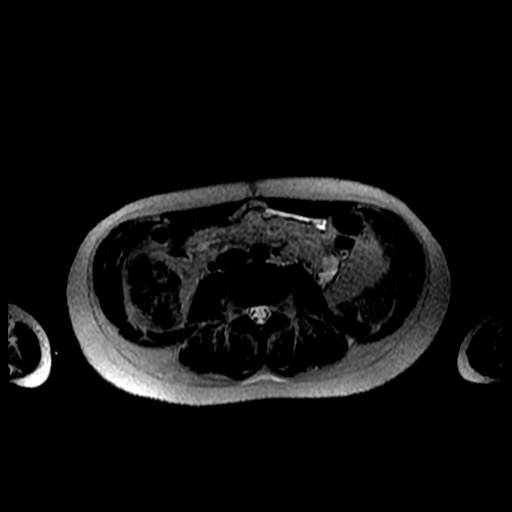
[im 40/40]
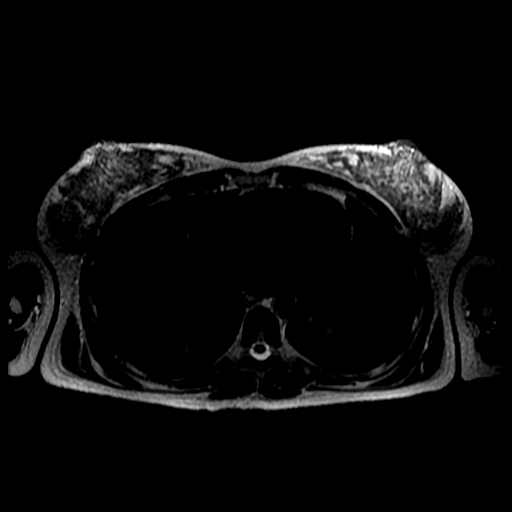

[Series 5: T2 · coronal · 5.0mm · 0.70mm/px · 3 of 33 slices shown (2 of 2)]
[im 1/33]
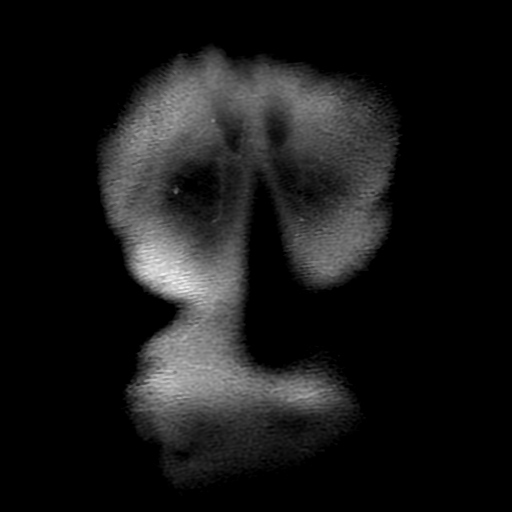
[im 17/33]
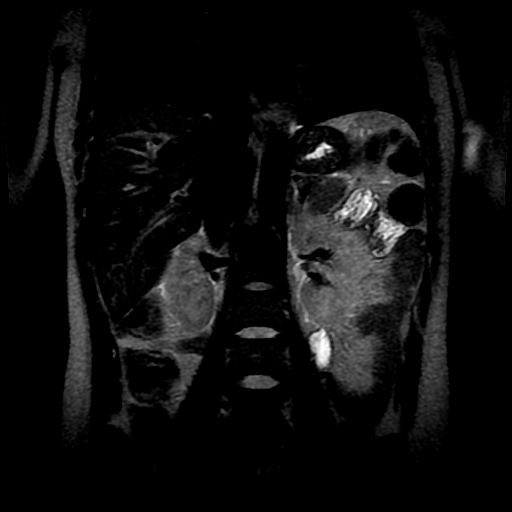
[im 33/33]
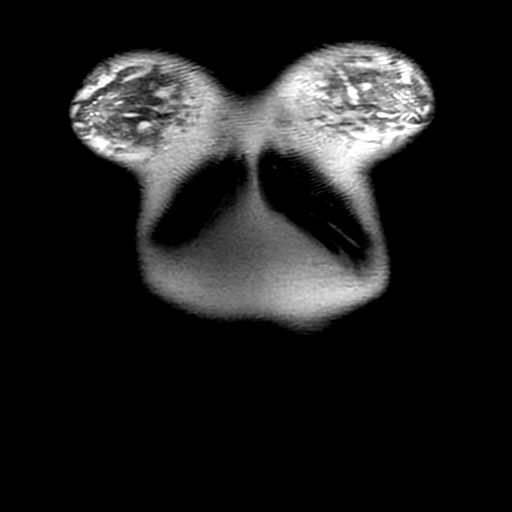

[Series 6: T2 fat-sat · axial · 5.0mm · 0.70mm/px · z∈[-68,+127]mm · 3 of 40 slices shown]
[im 1/40]
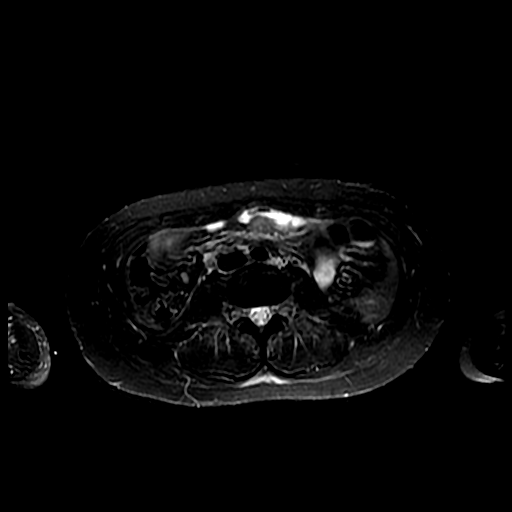
[im 20/40]
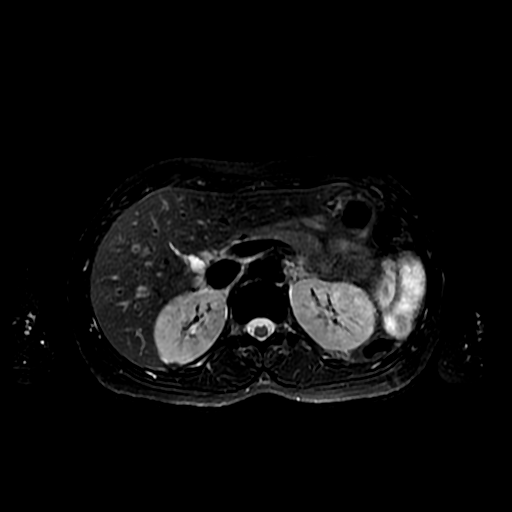
[im 40/40]
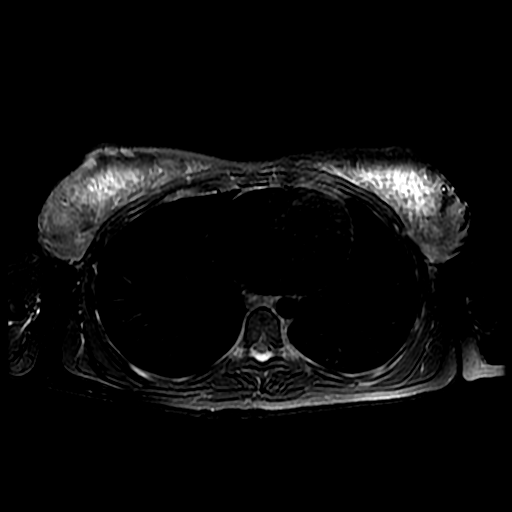

[Series 7: MRCP · coronal · 2.0mm · 0.70mm/px · 3 of 32 slices shown (1 of 2)]
[im 1/32]
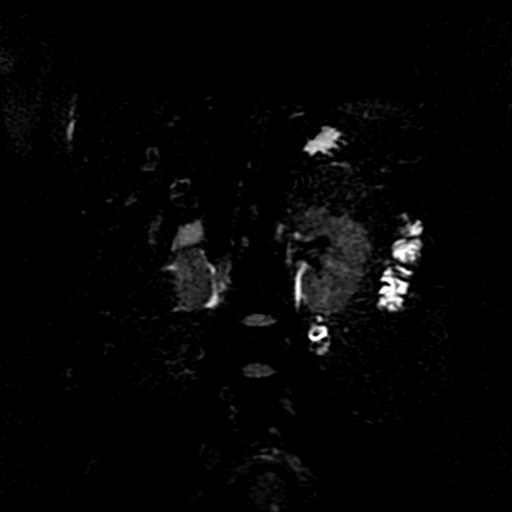
[im 16/32]
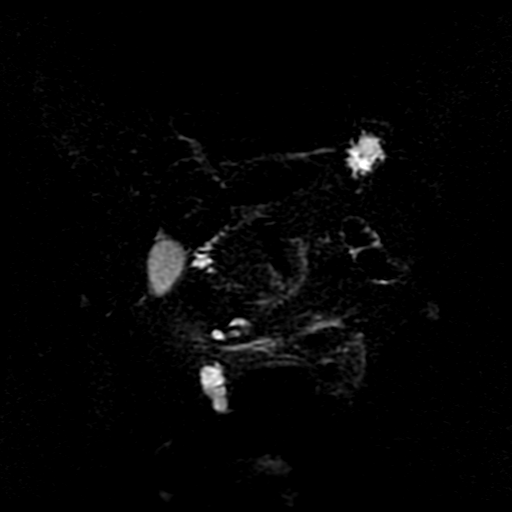
[im 32/32]
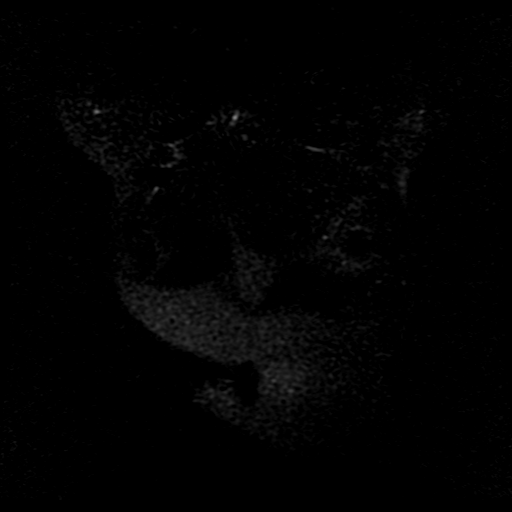

[Series 9: DWI b500 · axial · 6.0mm · 1.48mm/px · z∈[-92,+134]mm · 5 of 60 slices shown]
[im 1/60]
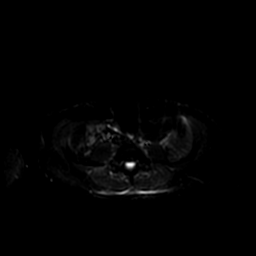
[im 15/60]
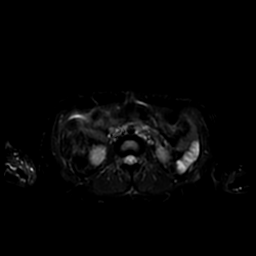
[im 30/60]
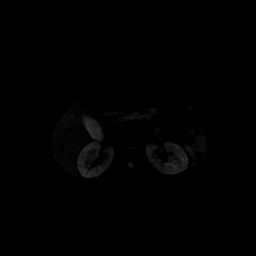
[im 45/60]
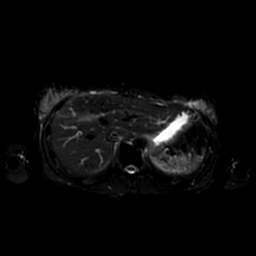
[im 60/60]
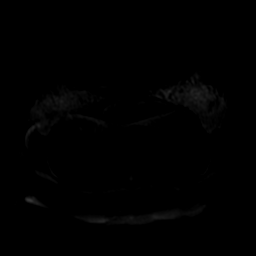

[Series 12: MRCP · sagittal · 40.0mm · 0.70mm/px · 1 of 12 slices shown (2 of 2)]
[im 1/12]
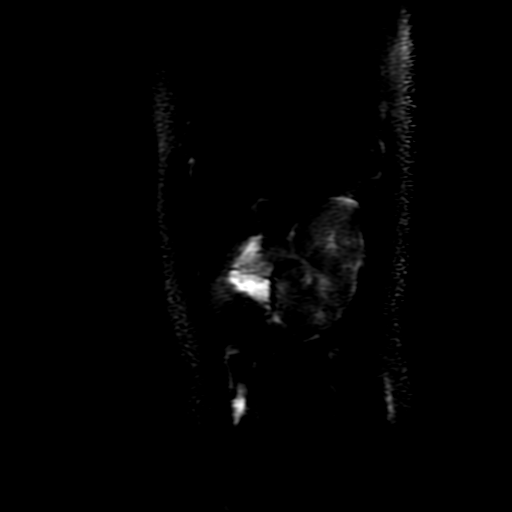

[Series 13: ax dualecho · axial · 5.0mm · 0.70mm/px · z∈[-59,+71]mm · 5 of 80 slices shown]
[im 1/80]
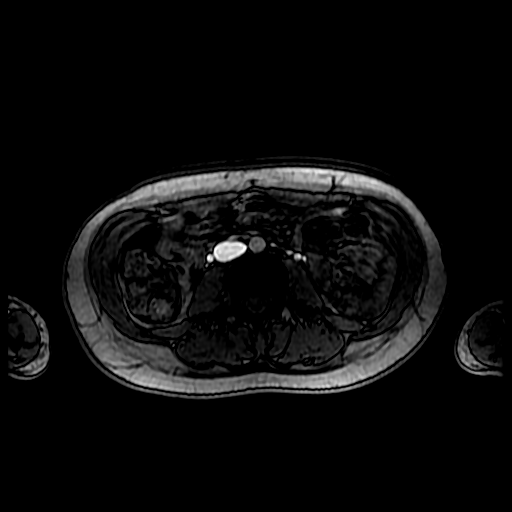
[im 14/80]
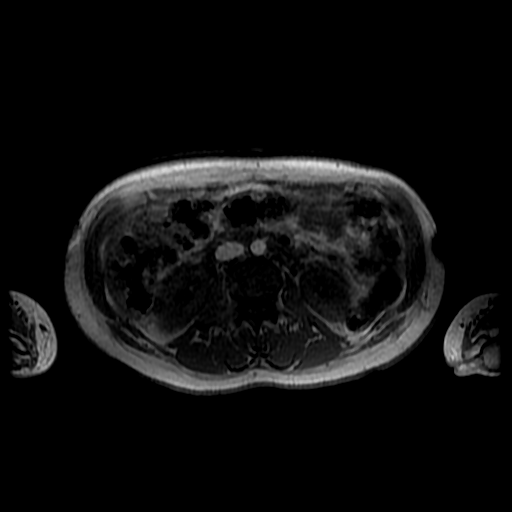
[im 27/80]
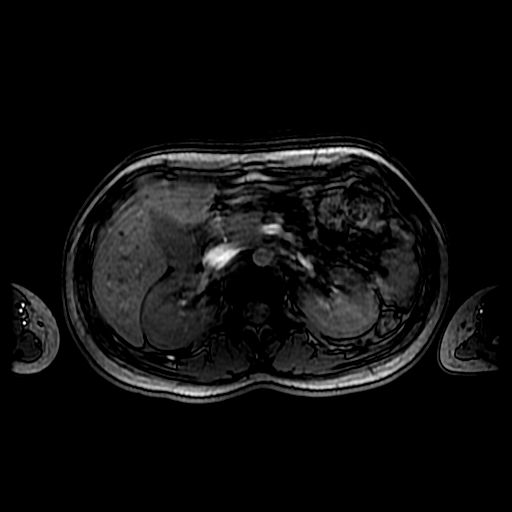
[im 40/80]
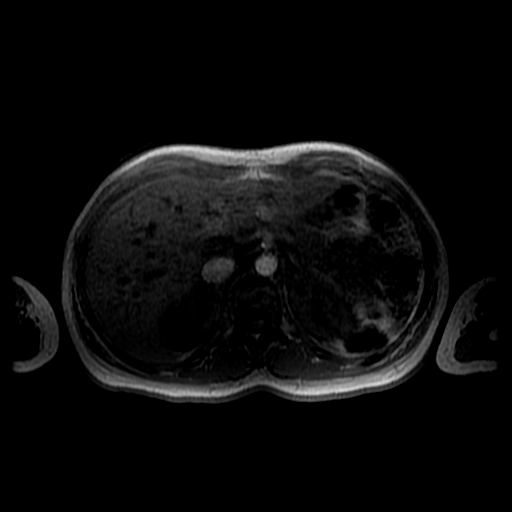
[im 53/80]
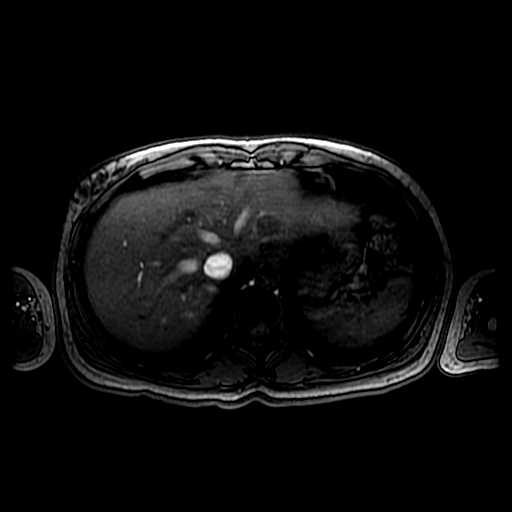

[22 of 48 positions shown; findings below may reference images not displayed]

FINDINGS: No focal hepatic lesions are noted. There is diffuse increased T2
signal intensity adjacent to the portal triads, compatible with
periportal edema. MRCP images demonstrate no intra or extrahepatic
biliary ductal dilatation. The common bile duct is normal in caliber
measuring 3 mm in the porta hepatis. Pancreatic duct is not dilated.
The gallbladder does not appear distended. No filling defects within
the gallbladder or within the common bile duct to suggest presence
of cholelithiasis or choledocholithiasis.

There is a small amount of increased T2 signal intensity in the
retroperitoneum adjacent to the pancreas. No well-defined
peripancreatic fluid collection is noted. The appearance of the
spleen, bilateral adrenal glands and bilateral kidneys is
unremarkable on today's non contrast MRI examination.
IMPRESSION: 1. No intra or extrahepatic biliary ductal dilatation.
2. Periportal edema in the liver.
3. Small amount of increased T2 signal intensity in the
retroperitoneum adjacent to the pancreas, presumably secondary to
inflammatory changes given the patient's elevated lipase.

## 2016-03-28 IMAGING — US US ABDOMEN COMPLETE
1 series · 14 of 25 positions shown · non-contrast
Comparison: None.

CLINICAL DATA: Elevated lipase and T bili, emesis during pregnancy.

EXAM:
ULTRASOUND ABDOMEN COMPLETE

[Series 1: us abdomen complete · 0.17mm/px · 14 of 62 slices shown]
[im 1/62]
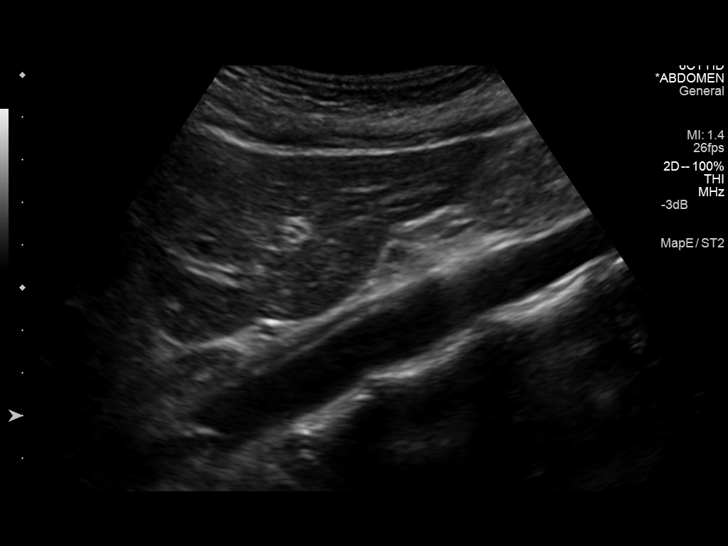
[im 6/62]
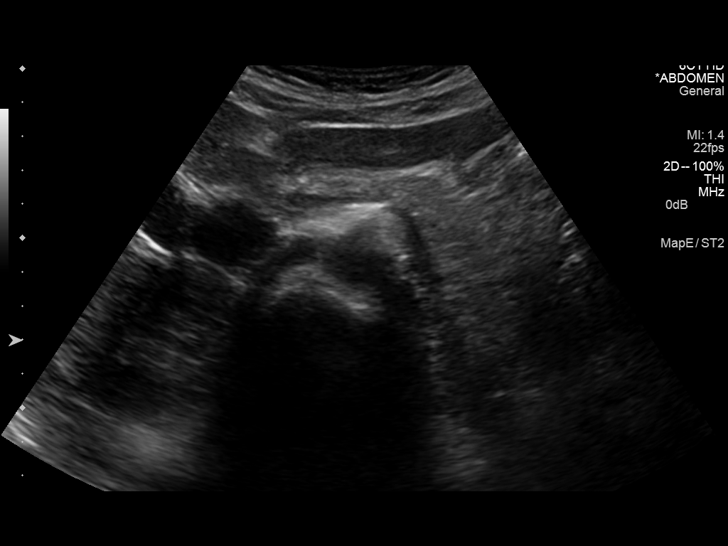
[im 11/62]
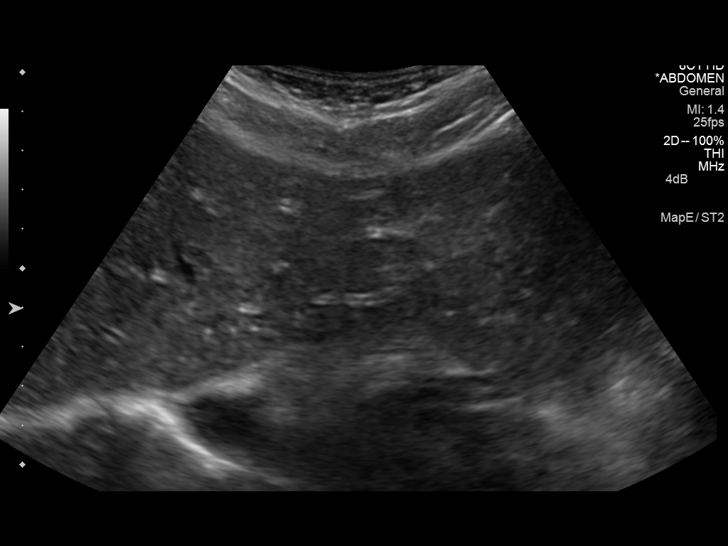
[im 16/62]
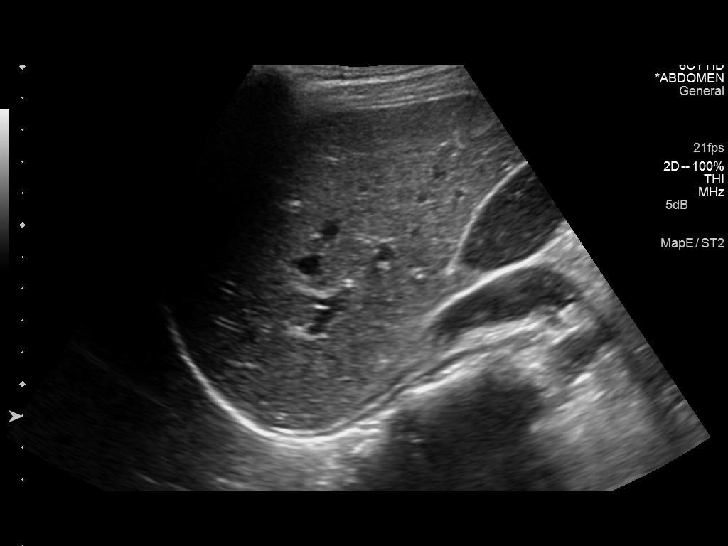
[im 21/62]
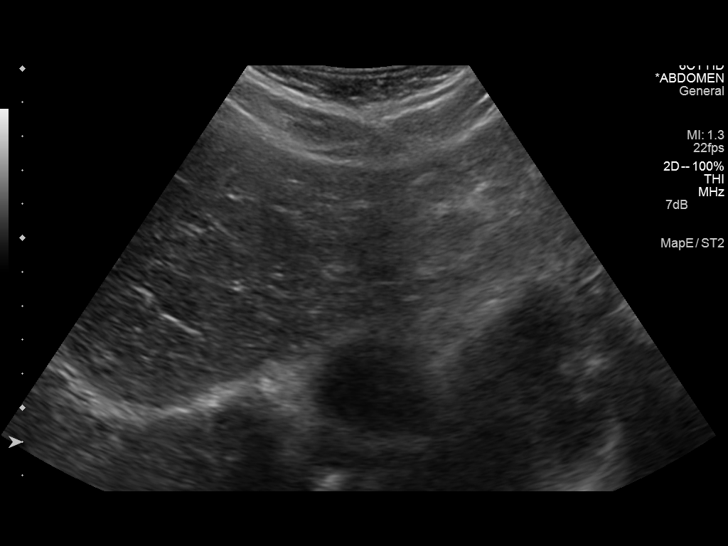
[im 23/62]
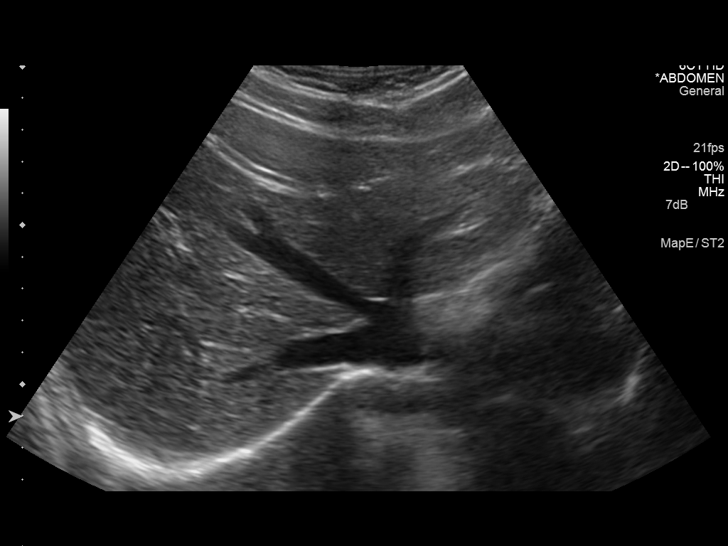
[im 28/62]
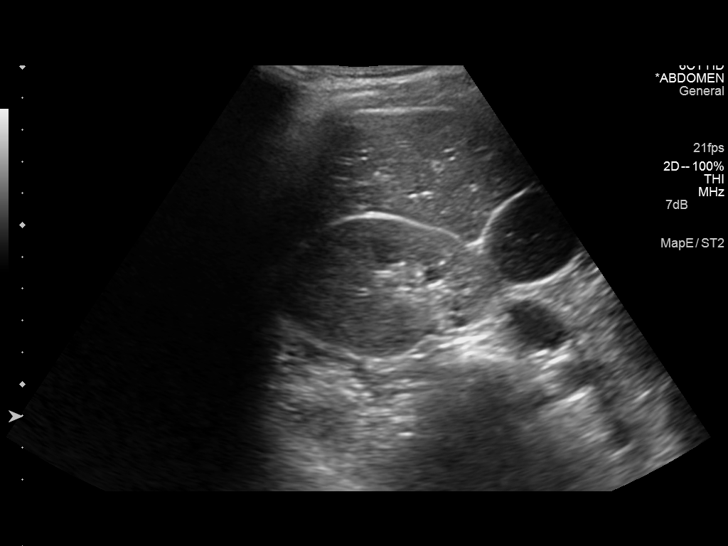
[im 34/62]
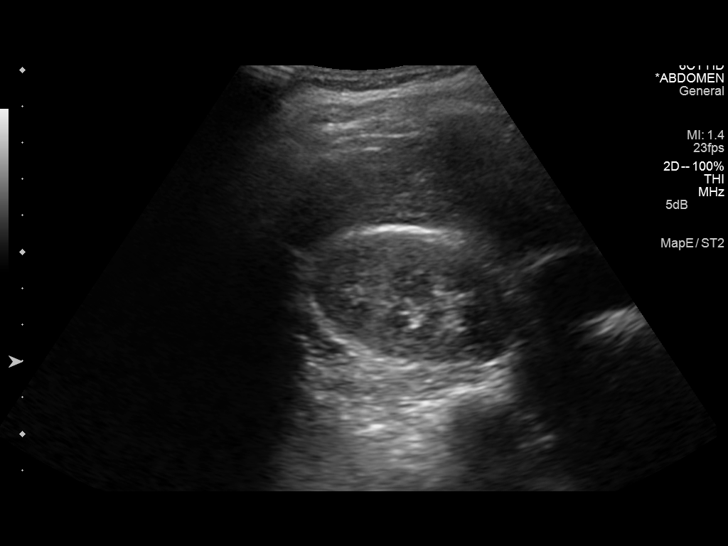
[im 39/62]
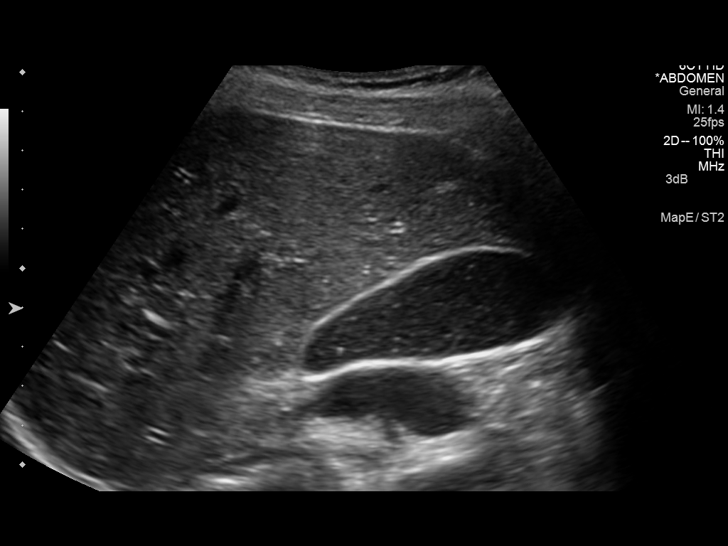
[im 41/62]
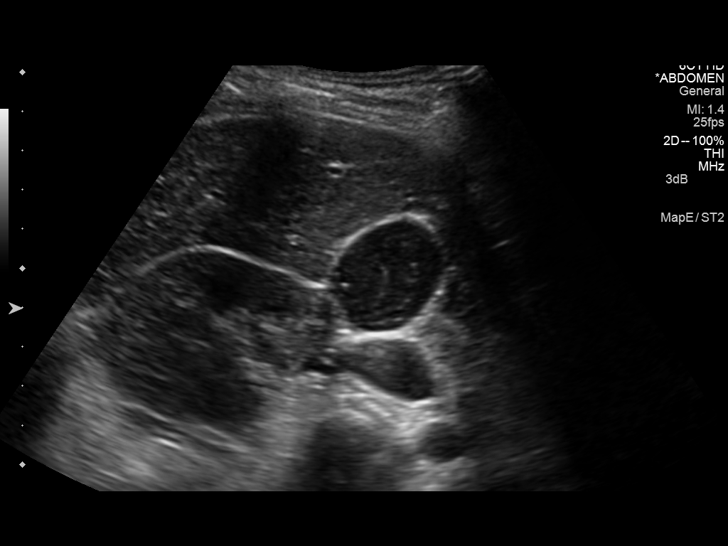
[im 46/62]
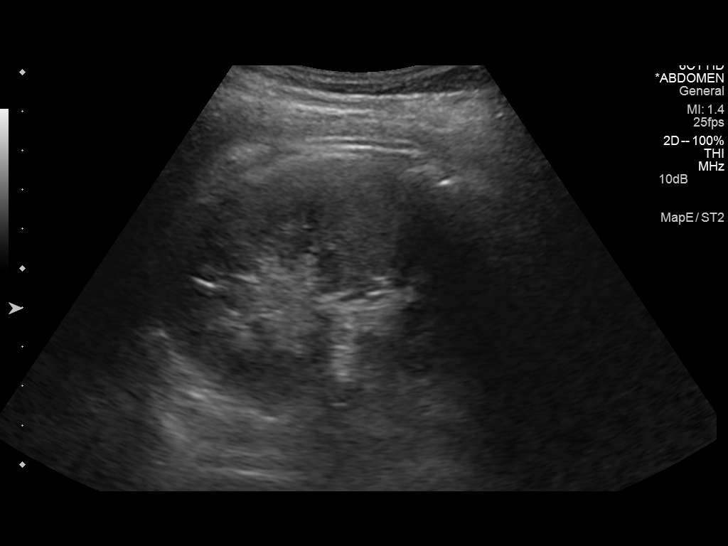
[im 51/62]
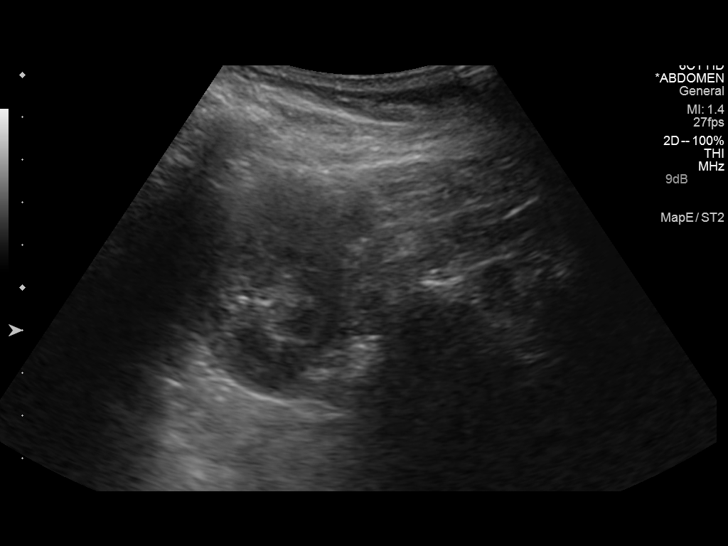
[im 56/62]
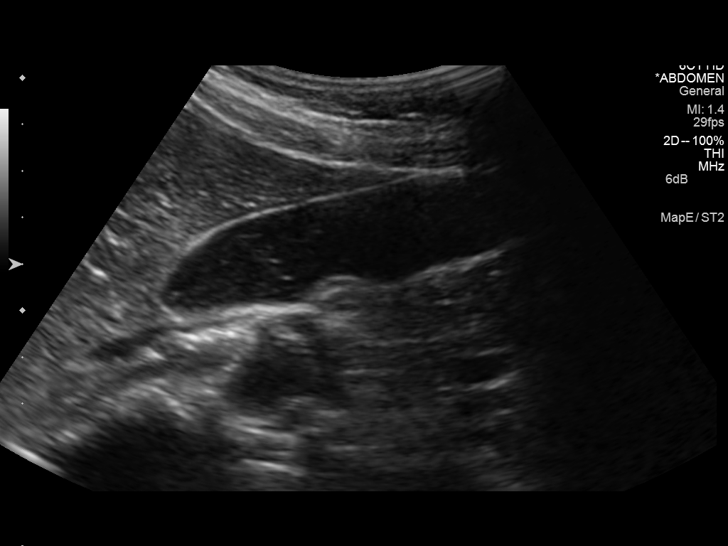
[im 62/62]
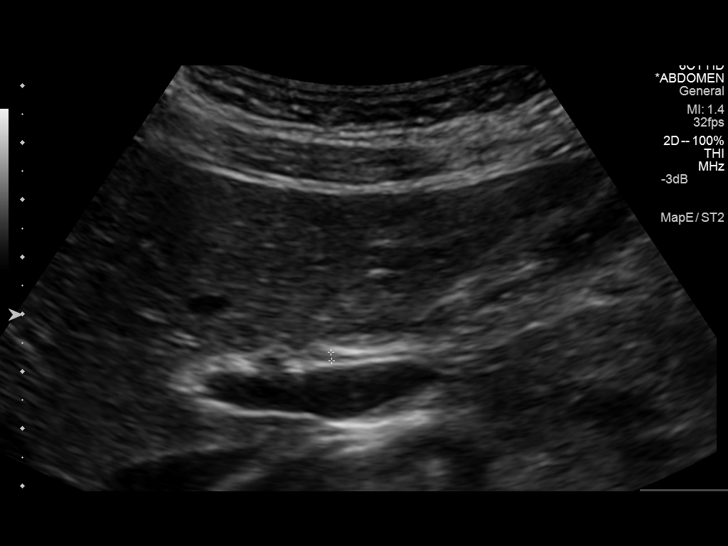

[14 of 25 positions shown; findings below may reference images not displayed]

FINDINGS: Gallbladder:

Multiple punctate echogenic foci central within the gallbladder. No
gallbladder wall thickening or pericholecystic fluid. No sonographic
Murphy's sign elicited.

Common bile duct:

Diameter: 2 mm

Liver:

No focal lesion identified. Within normal limits in parenchymal
echogenicity.

IVC:

No abnormality visualized.

Pancreas:

Visualized portion unremarkable.

Spleen:

Size and appearance within normal limits.

Right Kidney:

Length: 10.4 cm. Echogenicity within normal limits. No mass or
hydronephrosis visualized.

Left Kidney:

Length: 10.1 cm. Echogenicity within normal limits. No mass or
hydronephrosis visualized.

Abdominal aorta:

No aneurysm visualized.

Other findings:

None.
IMPRESSION: Gallbladder sludge without sonographic findings of acute
cholecystitis.

  By: Hans-Magne Echem

## 2018-01-20 ENCOUNTER — Other Ambulatory Visit (HOSPITAL_COMMUNITY)
Admission: RE | Admit: 2018-01-20 | Discharge: 2018-01-20 | Disposition: A | Discharge status: Home / Self Care | Payer: Medicaid Other | Source: Ambulatory Visit | Attending: Obstetrics and Gynecology | Admitting: Obstetrics and Gynecology

## 2018-01-20 ENCOUNTER — Ambulatory Visit (INDEPENDENT_AMBULATORY_CARE_PROVIDER_SITE_OTHER): Payer: Medicaid Other | Admitting: Obstetrics and Gynecology

## 2018-01-20 ENCOUNTER — Encounter: Payer: Self-pay | Admitting: Obstetrics and Gynecology

## 2018-01-20 VITALS — BP 134/90 | HR 71 | Wt 152.6 lb

## 2018-01-20 DIAGNOSIS — Z01419 Encounter for gynecological examination (general) (routine) without abnormal findings: Secondary | ICD-10-CM | POA: Diagnosis not present

## 2018-01-20 DIAGNOSIS — Z0000 Encounter for general adult medical examination without abnormal findings: Secondary | ICD-10-CM | POA: Diagnosis not present

## 2018-01-20 NOTE — Progress Notes (Signed)
STD testing  Last pap 2016

## 2018-01-20 NOTE — Progress Notes (Signed)
Obstetrics and Gynecology Annual Patient Evaluation  Appointment Date: 01/20/2018  [facility]  Primary Care Provider: Patient, No Pcp Per  Chief Complaint:  Chief Complaint  Patient presents with  . Gynecologic Exam    History of Present Illness: Toni BusmanKhaliah Deckard is a 27 y.o. African-American G2P1011 (Patient's last menstrual period was 01/18/2018.), seen for the above chief complaint. Her past medical history is significant for h/o STI   Only issue today is sometimes notices vaginal discharge after her period   No breast s/s, fevers, chills, chest pain, SOB, nausea, vomiting, abdominal pain, dysuria, hematuria, vaginal itching, dyspareunia, diarrhea, constipation, blood in BMs  Review of Systems: as noted in the History of Present Illness.   Past Medical History:  Past Medical History:  Diagnosis Date  . Medical history non-contributory     Past Surgical History:  Past Surgical History:  Procedure Laterality Date  . CHOLECYSTECTOMY N/A 09/09/2014   Procedure: LAPAROSCOPIC CHOLECYSTECTOMY WITH INTRAOPERATIVE CHOLANGIOGRAM;  Surgeon: Manus RuddMatthew Tsuei, MD;  Location: [facility] OR;  Service: General;  Laterality: N/A;    Past Obstetrical History:  OB History  Gravida Para Term Preterm AB Living  2 1 1   1 1   SAB TAB Ectopic Multiple Live Births  1     0 1    # Outcome Date GA Lbr Len/2nd Weight Sex Delivery Anes PTL Lv  2 Term 03/10/15 153w3d 11:16 / 00:47 7 lb 3.3 oz (3.27 kg) F Vag-Spont EPI  LIV     Birth Comments: Hgb, Normal, FA Newborn Screen [phone] Date Collected: 03/11/2015  1 SAB 01/2014              Past Gynecological History: As per HPI. History of Pap Smear(s): Yes.   Last pap 2016, which was NILM History of STI(s): Yes.   She is currently using Nexplanon for contraception; placed 06/2015.   Social History:  Social History   Socioeconomic History  . Marital status: Single    Spouse name: Not on file  .  Number of children: Not on file  . Years of education: Not on file  . Highest education level: Not on file  Social Needs  . Financial resource strain: Not on file  . Food insecurity - worry: Not on file  . Food insecurity - inability: Not on file  . Transportation needs - medical: Not on file  . Transportation needs - non-medical: Not on file  Occupational History  . Not on file  Tobacco Use  . Smoking status: Never Smoker  . Smokeless tobacco: Former Engineer, waterUser  Substance and Sexual Activity  . Alcohol use: No  . Drug use: No  . Sexual activity: Yes    Birth control/protection: None  Other Topics Concern  . Not on file  Social History Narrative  . Not on file    Family History:  Family History  Problem Relation Age of Onset  . Healthy Mother   . Healthy Father      Medications Nexplanon  Allergies Patient has no known allergies.   Physical Exam:  BP 134/90   Pulse 71   Wt 152 lb 9.6 oz (69.2 kg)   LMP 01/18/2018   BMI 26.19 kg/m  Body mass index is 26.19 kg/m. General appearance: Well nourished, well developed female in no acute distress.  Neck:  Supple, normal appearance, and no thyromegaly  Cardiovascular: normal s1 and s2.  No murmurs, rubs or gallops. Respiratory:  Clear to auscultation bilateral. Normal respiratory  effort Abdomen: positive bowel sounds and no masses, hernias; diffusely non tender to palpation, non distended Neuro/Psych:  Normal mood and affect.  Skin:  Warm and dry.  Lymphatic:  No inguinal lymphadenopathy.   Pelvic exam: is not limited by body habitus EGBUS: within normal limits, Vagina: within normal limits and with no blood or discharge in the vault, Cervix: normal appearing cervix without tenderness, discharge or lesions. Uterus:  nonenlarged and non tender and Adnexa:  normal adnexa and no mass, fullness, tenderness Rectovaginal: deferred  Laboratory: deferred  Radiology: none  Assessment: pt doing well  Plan:  1. Encounter for  gynecological examination F/u swab and STI testing. Pap updated. D/w pt RTC 33m for nexplanon removal. Pt thinks she'll want to be on OCPs - Cervicovaginal ancillary only - HIV antibody (with reflex) - RPR - Hepatitis B Surface AntiGEN - Hepatitis C Antibody  RTC 79m  Cornelia Copa MD Attending [facility])

## 2018-01-21 LAB — CERVICOVAGINAL ANCILLARY ONLY
Bacterial vaginitis: NEGATIVE
CANDIDA VAGINITIS: NEGATIVE
CHLAMYDIA, DNA PROBE: POSITIVE — AB
Neisseria Gonorrhea: NEGATIVE
Trichomonas: POSITIVE — AB

## 2018-01-21 LAB — HEPATITIS C ANTIBODY: HEP C VIRUS AB: 0.1 {s_co_ratio} (ref 0.0–0.9)

## 2018-01-21 LAB — HIV ANTIBODY (ROUTINE TESTING W REFLEX): HIV SCREEN 4TH GENERATION: NONREACTIVE

## 2018-01-21 LAB — HEPATITIS B SURFACE ANTIGEN: Hepatitis B Surface Ag: NEGATIVE

## 2018-01-21 LAB — RPR: RPR: NONREACTIVE

## 2018-01-22 ENCOUNTER — Other Ambulatory Visit: Payer: Self-pay | Admitting: Obstetrics and Gynecology

## 2018-01-22 ENCOUNTER — Encounter: Payer: Self-pay | Admitting: Obstetrics and Gynecology

## 2018-01-22 DIAGNOSIS — A599 Trichomoniasis, unspecified: Secondary | ICD-10-CM

## 2018-01-22 DIAGNOSIS — A749 Chlamydial infection, unspecified: Secondary | ICD-10-CM | POA: Insufficient documentation

## 2018-01-22 MED ORDER — AZITHROMYCIN 500 MG PO TABS: 1000.0000 mg | ORAL_TABLET | Freq: Once | ORAL | 0 refills | Status: AC

## 2018-01-22 MED ORDER — METRONIDAZOLE 500 MG PO TABS: 2000.0000 mg | ORAL_TABLET | Freq: Once | ORAL | 0 refills | Status: AC

## 2018-03-09 ENCOUNTER — Other Ambulatory Visit (HOSPITAL_COMMUNITY)
Admission: RE | Admit: 2018-03-09 | Discharge: 2018-03-09 | Disposition: A | Discharge status: Home / Self Care | Payer: Medicaid Other | Source: Ambulatory Visit | Attending: Obstetrics & Gynecology | Admitting: Obstetrics & Gynecology

## 2018-03-09 ENCOUNTER — Other Ambulatory Visit (INDEPENDENT_AMBULATORY_CARE_PROVIDER_SITE_OTHER): Payer: Medicaid Other | Admitting: *Deleted

## 2018-03-09 DIAGNOSIS — A5901 Trichomonal vulvovaginitis: Secondary | ICD-10-CM | POA: Diagnosis not present

## 2018-03-09 DIAGNOSIS — Z01419 Encounter for gynecological examination (general) (routine) without abnormal findings: Secondary | ICD-10-CM | POA: Insufficient documentation

## 2018-03-09 DIAGNOSIS — A7489 Other chlamydial diseases: Secondary | ICD-10-CM | POA: Insufficient documentation

## 2018-03-09 DIAGNOSIS — A749 Chlamydial infection, unspecified: Secondary | ICD-10-CM

## 2018-03-09 DIAGNOSIS — N760 Acute vaginitis: Secondary | ICD-10-CM

## 2018-03-09 DIAGNOSIS — B9689 Other specified bacterial agents as the cause of diseases classified elsewhere: Secondary | ICD-10-CM

## 2018-03-09 NOTE — Progress Notes (Signed)
Pt came in today for TOC. Positive Chlamydia and Trichomonas 01/20/18. She states she has taken medication and has no symptoms.

## 2018-03-10 LAB — CERVICOVAGINAL ANCILLARY ONLY
Bacterial vaginitis: POSITIVE — AB
CHLAMYDIA, DNA PROBE: NEGATIVE
Candida vaginitis: NEGATIVE
Neisseria Gonorrhea: NEGATIVE
Trichomonas: NEGATIVE

## 2018-03-10 NOTE — Progress Notes (Signed)
I have reviewed the chart and agree with nursing staff's documentation of this patient's encounter.  Mishka Stegemann, MD 03/10/2018 8:33 AM    

## 2018-03-11 MED ORDER — METRONIDAZOLE 500 MG PO TABS: 500.0000 mg | ORAL_TABLET | Freq: Two times a day (BID) | ORAL | 0 refills | Status: AC

## 2018-03-11 NOTE — Addendum Note (Signed)
Addended by: Jaynie CollinsANYANWU, Allura Doepke A on: 03/11/2018 01:41 PM   Modules accepted: Orders

## 2018-04-27 ENCOUNTER — Encounter: Payer: Self-pay | Admitting: Radiology

## 2018-06-08 ENCOUNTER — Encounter: Payer: Self-pay | Admitting: Obstetrics & Gynecology

## 2018-06-08 ENCOUNTER — Other Ambulatory Visit (HOSPITAL_COMMUNITY)
Admission: RE | Admit: 2018-06-08 | Discharge: 2018-06-08 | Disposition: A | Discharge status: Home / Self Care | Payer: Medicaid Other | Source: Ambulatory Visit | Attending: Obstetrics & Gynecology | Admitting: Obstetrics & Gynecology

## 2018-06-08 ENCOUNTER — Ambulatory Visit (INDEPENDENT_AMBULATORY_CARE_PROVIDER_SITE_OTHER): Payer: Medicaid Other | Admitting: Obstetrics & Gynecology

## 2018-06-08 VITALS — BP 114/72 | Wt 140.0 lb

## 2018-06-08 DIAGNOSIS — Z113 Encounter for screening for infections with a predominantly sexual mode of transmission: Secondary | ICD-10-CM | POA: Diagnosis present

## 2018-06-08 DIAGNOSIS — Z3049 Encounter for surveillance of other contraceptives: Secondary | ICD-10-CM | POA: Diagnosis not present

## 2018-06-08 DIAGNOSIS — Z3046 Encounter for surveillance of implantable subdermal contraceptive: Secondary | ICD-10-CM

## 2018-06-08 DIAGNOSIS — Z30011 Encounter for initial prescription of contraceptive pills: Secondary | ICD-10-CM

## 2018-06-08 MED ORDER — NORETHIN ACE-ETH ESTRAD-FE 1-20 MG-MCG(24) PO TABS: 1.0000 | ORAL_TABLET | Freq: Every day | ORAL | 11 refills | Status: AC

## 2018-06-08 NOTE — Progress Notes (Signed)
     GYNECOLOGY OFFICE PROCEDURE NOTE  Toni Romero is a 27 y.o. G2P1011 here for Nexplanon removal as it has been in place for 3 years. Desires OCPs.  Last pap smear was on 05/04/2015 and was normal.  No other gynecologic concerns.  Nexplanon Removal Patient identified, informed consent performed, consent signed.   Appropriate time out taken. Nexplanon site identified.  Area prepped in usual sterile fashon. One ml of 1% lidocaine was used to anesthetize the area at the distal end of the implant. A small stab incision was made right beside the implant on the distal portion.  The Nexplanon rod was grasped using hemostats and removed without difficulty.  There was minimal blood loss. There were no complications.  3 ml of 1% lidocaine was injected around the incision for post-procedure analgesia.  Steri-strips were applied over the small incision.  A pressure bandage was applied to reduce any bruising.  The patient tolerated the procedure well and was given post procedure instructions.  Patient is planning to use OCPs for contraception; Loestrin 24 Fe prescribed.  Patient also desired cervicovaginal STI screen; self-swab done. Will follow up results and manage accordingly. Declines serum testing. Recommended safe sex practices.  Return in about 2 months (around 08/08/2018) for OCP check and Annual exam/pap smear.   Jaynie CollinsUGONNA  Elvie Palomo, MD, FACOG Obstetrician & Gynecologist, [facility], [facility]

## 2018-06-08 NOTE — Patient Instructions (Signed)
Return to clinic for any scheduled appointments or for any gynecologic concerns as needed.   

## 2018-06-11 LAB — CERVICOVAGINAL ANCILLARY ONLY
CHLAMYDIA, DNA PROBE: NEGATIVE
NEISSERIA GONORRHEA: NEGATIVE
Trichomonas: NEGATIVE

## 2018-11-30 ENCOUNTER — Encounter: Payer: Self-pay | Admitting: Radiology
